# Patient Record
Sex: Female | Born: 1995 | Race: White | Hispanic: No | Marital: Single | Smoking: Never smoker
Health system: Southern US, Community
[De-identification: ages and names within clinical notes are randomized; demographics above are authoritative.]

## PROBLEM LIST (undated history)

## (undated) DIAGNOSIS — Z8619 Personal history of other infectious and parasitic diseases: Secondary | ICD-10-CM

## (undated) HISTORY — DX: Personal history of other infectious and parasitic diseases: Z86.19

---
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
## 2017-04-14 ENCOUNTER — Emergency Department (HOSPITAL_COMMUNITY)
Admission: EM | Admit: 2017-04-14 | Discharge: 2017-04-14 | Disposition: A | Discharge status: Home / Self Care | Payer: Self-pay | Attending: Emergency Medicine | Admitting: Emergency Medicine

## 2017-04-14 ENCOUNTER — Encounter (HOSPITAL_COMMUNITY): Payer: Self-pay | Admitting: Emergency Medicine

## 2017-04-14 ENCOUNTER — Other Ambulatory Visit: Payer: Self-pay

## 2017-04-14 DIAGNOSIS — R112 Nausea with vomiting, unspecified: Secondary | ICD-10-CM | POA: Insufficient documentation

## 2017-04-14 DIAGNOSIS — R197 Diarrhea, unspecified: Secondary | ICD-10-CM

## 2017-04-14 DIAGNOSIS — N390 Urinary tract infection, site not specified: Secondary | ICD-10-CM | POA: Insufficient documentation

## 2017-04-14 DIAGNOSIS — E860 Dehydration: Secondary | ICD-10-CM | POA: Insufficient documentation

## 2017-04-14 LAB — COMPREHENSIVE METABOLIC PANEL WITH GFR
ALT: 14 U/L (ref 14–54)
AST: 15 U/L (ref 15–41)
Albumin: 4.7 g/dL (ref 3.5–5.0)
Alkaline Phosphatase: 49 U/L (ref 38–126)
Anion gap: 13 (ref 5–15)
BUN: 12 mg/dL (ref 6–20)
CO2: 22 mmol/L (ref 22–32)
Calcium: 9.5 mg/dL (ref 8.9–10.3)
Chloride: 102 mmol/L (ref 101–111)
Creatinine, Ser: 0.61 mg/dL (ref 0.44–1.00)
GFR calc Af Amer: >60 mL/min
GFR calc non Af Amer: >60 mL/min
Glucose, Bld: 92 mg/dL (ref 65–99)
Potassium: 3.4 mmol/L — ABNORMAL LOW (ref 3.5–5.1)
Sodium: 137 mmol/L (ref 135–145)
Total Bilirubin: 1.1 mg/dL (ref 0.3–1.2)
Total Protein: 8 g/dL (ref 6.5–8.1)

## 2017-04-14 LAB — URINALYSIS, ROUTINE W REFLEX MICROSCOPIC
Bilirubin Urine: NEGATIVE
Glucose, UA: NEGATIVE mg/dL
Hgb urine dipstick: NEGATIVE
Ketones, ur: 80 mg/dL — AB
Nitrite: NEGATIVE
Protein, ur: 30 mg/dL — AB
Specific Gravity, Urine: 1.031 — ABNORMAL HIGH (ref 1.005–1.030)
pH: 5 (ref 5.0–8.0)

## 2017-04-14 LAB — CBC
HCT: 44.9 % (ref 36.0–46.0)
Hemoglobin: 14.5 g/dL (ref 12.0–15.0)
MCH: 28.2 pg (ref 26.0–34.0)
MCHC: 32.3 g/dL (ref 30.0–36.0)
MCV: 87.2 fL (ref 78.0–100.0)
Platelets: 295 K/uL (ref 150–400)
RBC: 5.15 MIL/uL — ABNORMAL HIGH (ref 3.87–5.11)
RDW: 12.9 % (ref 11.5–15.5)
WBC: 6.6 K/uL (ref 4.0–10.5)

## 2017-04-14 LAB — PREGNANCY, URINE: Preg Test, Ur: NEGATIVE

## 2017-04-14 LAB — LIPASE, BLOOD: Lipase: 22 U/L (ref 11–51)

## 2017-04-14 MED ORDER — CEPHALEXIN 500 MG PO CAPS: 500.0000 mg | ORAL_CAPSULE | Freq: Four times a day (QID) | ORAL | 0 refills | Status: DC

## 2017-04-14 MED ORDER — ONDANSETRON HCL 4 MG PO TABS: 4.0000 mg | ORAL_TABLET | Freq: Four times a day (QID) | ORAL | 0 refills | Status: DC

## 2017-04-14 MED ORDER — SODIUM CHLORIDE 0.9 % IV BOLUS (SEPSIS)
1000.0000 mL | Freq: Once | INTRAVENOUS | Status: AC
Start: 2017-04-14 — End: 2017-04-14
  Administered 2017-04-14: 1000 mL via INTRAVENOUS

## 2017-04-14 MED ORDER — ONDANSETRON HCL 4 MG/2ML IJ SOLN
4.0000 mg | Freq: Once | INTRAMUSCULAR | Status: AC
  Administered 2017-04-14: 4 mg via INTRAVENOUS
  Filled 2017-04-14: qty 2

## 2017-04-14 NOTE — ED Notes (Signed)
Pt c/o n/v for the past two days, EDP in prior to RN, see edp assessment for further,

## 2017-04-14 NOTE — ED Provider Notes (Signed)
[facility] EMERGENCY DEPARTMENT Provider Note   CSN: [id number] Arrival date & time: 04/14/17  1603     History   Chief Complaint Chief Complaint  Patient presents with  . Diarrhea    HPI Felicia Rodriguez is a 22 y.o. female.  She presents to the emergency department with nausea and vomiting and diarrhea for 3 days.  She continues to have multiple episodes of vomiting.  The diarrhea in the vomitus is nonbloody.  There is been no associated fever.  No sick contacts.  She is tried Pepto and applesauce without any relief.  She is no surgical history.  There are no urinary symptoms no vaginal bleeding no discharge.  Last menstrual period was the middle of February. The history is provided by the patient.  Diarrhea   This is a new problem. The current episode started more than 2 days ago. The problem occurs 2 to 4 times per day. The problem has not changed since onset.The stool consistency is described as watery. There has been no fever. Associated symptoms include abdominal pain (cramps) and vomiting. Pertinent negatives include no chills, no sweats, no headaches, no arthralgias, no myalgias, no URI and no cough. She has tried [employer] for the symptoms. The treatment provided no relief. Her past medical history does not include inflammatory bowel disease or recent abdominal surgery.    History reviewed. No pertinent past medical history.  There are no active problems to display for this patient.   History reviewed. No pertinent surgical history.  OB History    Gravida Para Term Preterm AB Living   0 0 0 0 0 0   SAB TAB Ectopic Multiple Live Births   0 0 0 0 0       Home Medications    Prior to Admission medications   Not on File    Family History History reviewed. No pertinent family history.  Social History Social History   Tobacco Use  . Smoking status: Never Smoker  . Smokeless tobacco: Never Used  Substance Use Topics  . Alcohol use: No    Frequency: Never    . Drug use: No     Allergies   Shellfish allergy   Review of Systems Review of Systems  Constitutional: Negative for chills and fever.  HENT: Negative for ear pain and sore throat.   Eyes: Negative for pain and visual disturbance.  Respiratory: Negative for cough and shortness of breath.   Cardiovascular: Negative for chest pain and palpitations.  Gastrointestinal: Positive for abdominal pain (cramps), diarrhea and vomiting.  Genitourinary: Negative for dysuria and hematuria.  Musculoskeletal: Negative for arthralgias, back pain and myalgias.  Skin: Negative for color change and rash.  Neurological: Negative for seizures, syncope and headaches.  All other systems reviewed and are negative.    Physical Exam Updated Vital Signs BP 136/79 (BP Location: Left Arm)   Pulse 88   Temp 98.3 F (36.8 C) (Oral)   Resp 18   Ht 5\' 8"  (1.727 m)   Wt 68.9 kg (152 lb)   LMP 03/21/2017   SpO2 100%   BMI 23.11 kg/m   Physical Exam  Constitutional: She appears well-developed and well-nourished. No distress.  HENT:  Head: Normocephalic and atraumatic.  Eyes: Conjunctivae are normal.  Neck: Neck supple.  Cardiovascular: Normal rate and regular rhythm.  No murmur heard. Pulmonary/Chest: Effort normal and breath sounds normal. No respiratory distress.  Abdominal: Soft. She exhibits no mass. There is no tenderness. There is no guarding.  Musculoskeletal: She exhibits no edema or tenderness.  Neurological: She is alert.  Skin: Skin is warm and dry.  Psychiatric: She has a normal mood and affect.  Nursing note and vitals reviewed.    ED Treatments / Results  Labs (all labs ordered are listed, but only abnormal results are displayed) Labs Reviewed  COMPREHENSIVE METABOLIC PANEL - Abnormal; Notable for the following components:      Result Value   Potassium 3.4 (*)    All other components within normal limits  CBC - Abnormal; Notable for the following components:   RBC 5.15 (*)     All other components within normal limits  URINALYSIS, ROUTINE W REFLEX MICROSCOPIC - Abnormal; Notable for the following components:   Color, Urine AMBER (*)    APPearance TURBID (*)    Specific Gravity, Urine 1.031 (*)    Ketones, ur 80 (*)    Protein, ur 30 (*)    Leukocytes, UA LARGE (*)    Bacteria, UA FEW (*)    Squamous Epithelial / LPF 6-30 (*)    All other components within normal limits  LIPASE, BLOOD  PREGNANCY, URINE    EKG  EKG Interpretation None       Radiology No results found.  Procedures Procedures (including critical care time)  Medications Ordered in ED Medications  sodium chloride 0.9 % bolus 1,000 mL (not administered)  ondansetron (ZOFRAN) injection 4 mg (not administered)     Initial Impression / Assessment and Plan / ED Course  I have reviewed the triage vital signs and the nursing notes.  Pertinent labs & imaging results that were available during my care of the patient were reviewed by me and considered in my medical decision making (see chart for details).     Final Clinical Impressions(s) / ED Diagnoses   Final diagnoses:  Nausea vomiting and diarrhea  Dehydration  Urinary tract infection in female    ED Discharge Orders        Ordered    cephALEXin (KEFLEX) 500 MG capsule  4 times daily,   Status:  Discontinued     04/14/17 2135    ondansetron (ZOFRAN) 4 MG tablet  Every 6 hours,   Status:  Discontinued     04/14/17 2135    cephALEXin (KEFLEX) 500 MG capsule  4 times daily     04/14/17 2138    ondansetron (ZOFRAN) 4 MG tablet  Every 6 hours     04/14/17 2138       Terrilee FilesButler, Kelsee Preslar C, MD 04/15/17 2133

## 2017-04-14 NOTE — ED Triage Notes (Signed)
PT c/o n/v/d x2 days. PT states no vomiting today but had 3 episodes of diarrhea but continues with nausea.

## 2017-04-14 NOTE — ED Notes (Signed)
PT tolerating po fluids with no problems noted,

## 2017-04-14 NOTE — Discharge Instructions (Signed)
Your evaluated in the emergency department for nausea and vomiting diarrhea.  Your blood work showed that you were dehydrated and have a possible urine infection.  You were given fluids and nausea medicine and are being sent home with nausea medicine and an antibiotic.  You should continue to try to stay well-hydrated and return if any worsening symptoms.

## 2017-04-14 NOTE — ED Notes (Signed)
Pt had one episode of vomiting while in the waiting room.

## 2020-06-09 ENCOUNTER — Encounter (HOSPITAL_COMMUNITY): Payer: Self-pay

## 2020-06-09 ENCOUNTER — Emergency Department (HOSPITAL_COMMUNITY)
Admission: EM | Admit: 2020-06-09 | Discharge: 2020-06-09 | Discharge status: Against Medical Advice | Payer: Self-pay | Attending: Emergency Medicine | Admitting: Emergency Medicine

## 2020-06-09 ENCOUNTER — Emergency Department (HOSPITAL_COMMUNITY): Payer: Self-pay

## 2020-06-09 DIAGNOSIS — S20212A Contusion of left front wall of thorax, initial encounter: Secondary | ICD-10-CM

## 2020-06-09 DIAGNOSIS — Z5321 Procedure and treatment not carried out due to patient leaving prior to being seen by health care provider: Secondary | ICD-10-CM

## 2020-06-09 DIAGNOSIS — M542 Cervicalgia: Secondary | ICD-10-CM | POA: Insufficient documentation

## 2020-06-09 DIAGNOSIS — R519 Headache, unspecified: Secondary | ICD-10-CM | POA: Insufficient documentation

## 2020-06-09 DIAGNOSIS — M25461 Effusion, right knee: Secondary | ICD-10-CM

## 2020-06-09 DIAGNOSIS — T7421XA Adult sexual abuse, confirmed, initial encounter: Secondary | ICD-10-CM

## 2020-06-09 DIAGNOSIS — S8001XA Contusion of right knee, initial encounter: Secondary | ICD-10-CM | POA: Insufficient documentation

## 2020-06-09 DIAGNOSIS — M545 Low back pain, unspecified: Secondary | ICD-10-CM | POA: Insufficient documentation

## 2020-06-09 DIAGNOSIS — M25561 Pain in right knee: Secondary | ICD-10-CM

## 2020-06-09 LAB — POC URINE PREG, ED: Preg Test, Ur: NEGATIVE

## 2020-06-09 MED ORDER — LIDOCAINE HCL (PF) 1 % IJ SOLN
1.0000 mL | Freq: Once | INTRAMUSCULAR | Status: AC
  Administered 2020-06-09: 1 mL
  Filled 2020-06-09: qty 30

## 2020-06-09 MED ORDER — AZITHROMYCIN 250 MG PO TABS
1000.0000 mg | ORAL_TABLET | Freq: Once | ORAL | Status: AC
  Administered 2020-06-09: 1000 mg via ORAL
  Filled 2020-06-09: qty 4

## 2020-06-09 MED ORDER — METRONIDAZOLE 500 MG PO TABS
2000.0000 mg | ORAL_TABLET | Freq: Once | ORAL | Status: AC
  Administered 2020-06-09: 2000 mg via ORAL
  Filled 2020-06-09: qty 4

## 2020-06-09 MED ORDER — CEFTRIAXONE SODIUM 1 G IJ SOLR
500.0000 mg | Freq: Once | INTRAMUSCULAR | Status: AC
  Administered 2020-06-09: 500 mg via INTRAMUSCULAR
  Filled 2020-06-09: qty 10
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
## 2020-06-09 NOTE — ED Notes (Addendum)
Patient not in her room. Calls to her phone have been made to locate her with no success. PA made aware.
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
## 2020-06-10 ENCOUNTER — Emergency Department (HOSPITAL_COMMUNITY): Payer: Self-pay

## 2020-06-10 ENCOUNTER — Encounter (HOSPITAL_COMMUNITY): Payer: Self-pay

## 2020-06-10 ENCOUNTER — Other Ambulatory Visit: Payer: Self-pay

## 2020-06-10 ENCOUNTER — Emergency Department (HOSPITAL_COMMUNITY)
Admission: EM | Admit: 2020-06-10 | Discharge: 2020-06-10 | Disposition: A | Discharge status: Home / Self Care | Payer: Self-pay | Attending: Emergency Medicine | Admitting: Emergency Medicine

## 2020-06-10 DIAGNOSIS — S0003XA Contusion of scalp, initial encounter: Secondary | ICD-10-CM | POA: Insufficient documentation

## 2020-06-10 DIAGNOSIS — S0990XA Unspecified injury of head, initial encounter: Secondary | ICD-10-CM

## 2020-06-10 DIAGNOSIS — R197 Diarrhea, unspecified: Secondary | ICD-10-CM | POA: Insufficient documentation

## 2020-06-10 MED ORDER — ONDANSETRON 4 MG PO TBDP: 4.0000 mg | ORAL_TABLET | Freq: Three times a day (TID) | ORAL | 0 refills | Status: DC | PRN

## 2020-06-10 MED ORDER — ONDANSETRON 4 MG PO TBDP
4.0000 mg | ORAL_TABLET | Freq: Once | ORAL | Status: AC
  Administered 2020-06-10: 4 mg via ORAL
  Filled 2020-06-10: qty 1

## 2020-06-10 MED ORDER — ACETAMINOPHEN 325 MG PO TABS
650.0000 mg | ORAL_TABLET | Freq: Once | ORAL | Status: AC
  Administered 2020-06-10: 650 mg via ORAL
  Filled 2020-06-10: qty 2

## 2020-06-10 MED ORDER — DOXYCYCLINE HYCLATE 100 MG PO CAPS: 100.0000 mg | ORAL_CAPSULE | Freq: Two times a day (BID) | ORAL | 0 refills | Status: DC
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
## 2020-06-13 ENCOUNTER — Telehealth: Payer: Self-pay | Admitting: Family Medicine
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
## 2020-06-13 NOTE — Telephone Encounter (Signed)
Called pt and scheduled her for Thursday, May 12th at 11:15 am.
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
## 2020-06-15 ENCOUNTER — Ambulatory Visit (INDEPENDENT_AMBULATORY_CARE_PROVIDER_SITE_OTHER): Payer: Self-pay | Admitting: Family Medicine

## 2020-06-15 ENCOUNTER — Other Ambulatory Visit: Payer: Self-pay

## 2020-06-15 VITALS — Ht 68.0 in | Wt 175.4 lb

## 2020-06-15 DIAGNOSIS — R21 Rash and other nonspecific skin eruption: Secondary | ICD-10-CM

## 2020-06-15 DIAGNOSIS — T7491XA Unspecified adult maltreatment, confirmed, initial encounter: Secondary | ICD-10-CM

## 2020-06-15 DIAGNOSIS — W57XXXA Bitten or stung by nonvenomous insect and other nonvenomous arthropods, initial encounter: Secondary | ICD-10-CM

## 2020-06-15 DIAGNOSIS — S060X0A Concussion without loss of consciousness, initial encounter: Secondary | ICD-10-CM

## 2020-06-15 DIAGNOSIS — S40862A Insect bite (nonvenomous) of left upper arm, initial encounter: Secondary | ICD-10-CM

## 2020-06-15 MED ORDER — TRIAMCINOLONE ACETONIDE 0.1 % EX CREA: 1.0000 "application " | TOPICAL_CREAM | Freq: Two times a day (BID) | CUTANEOUS | 1 refills | Status: DC

## 2020-06-15 MED ORDER — NORTRIPTYLINE HCL 25 MG PO CAPS: 25.0000 mg | ORAL_CAPSULE | Freq: Every day | ORAL | 2 refills | Status: DC

## 2020-06-15 MED ORDER — DOXYCYCLINE HYCLATE 100 MG PO CAPS: 100.0000 mg | ORAL_CAPSULE | Freq: Two times a day (BID) | ORAL | 0 refills | Status: AC

## 2020-06-15 NOTE — Patient Instructions (Addendum)
Thank you for coming in today.  Try the nortyptline at bedtime.  It should help with sleep and headache.   I would like to see you in 2 weeks.    [facility] [address] [place], [place] [address] [phone] Main [phone] Direct  [facility] [address] [place], [place] [address] [phone]) [phone] Direct   If you are in immediate danger, please call 911, or [facility] 24 hour Crisis Hotline at [phone].

## 2020-06-16 ENCOUNTER — Other Ambulatory Visit: Payer: Self-pay

## 2020-06-16 ENCOUNTER — Telehealth: Payer: Self-pay | Admitting: Family Medicine

## 2020-06-16 ENCOUNTER — Emergency Department (HOSPITAL_COMMUNITY)
Admission: EM | Admit: 2020-06-16 | Discharge: 2020-06-16 | Discharge status: Against Medical Advice | Payer: Self-pay | Attending: Emergency Medicine | Admitting: Emergency Medicine

## 2020-06-16 ENCOUNTER — Encounter (HOSPITAL_COMMUNITY): Payer: Self-pay

## 2020-06-16 DIAGNOSIS — W57XXXA Bitten or stung by nonvenomous insect and other nonvenomous arthropods, initial encounter: Secondary | ICD-10-CM | POA: Insufficient documentation

## 2020-06-16 DIAGNOSIS — S40862A Insect bite (nonvenomous) of left upper arm, initial encounter: Secondary | ICD-10-CM | POA: Insufficient documentation

## 2020-06-16 DIAGNOSIS — R21 Rash and other nonspecific skin eruption: Secondary | ICD-10-CM

## 2020-06-16 MED ORDER — PREDNISONE 50 MG PO TABS: 50.0000 mg | ORAL_TABLET | Freq: Every day | ORAL | 0 refills | Status: DC

## 2020-06-16 NOTE — Telephone Encounter (Signed)
STOP doxycycline. It is possible that you are allergic to it.   START prednisone now.   I tried to call and cannot leave a message. I sent her a text asking her to call back.

## 2020-06-16 NOTE — ED Triage Notes (Signed)
Pt presents to ED with complaints of itchy rash all over body and swelling around eyes started yesterday. Pt was given Triamcinolone Cream 0.1% yesterday and used it but states the rash is worse this am.

## 2020-06-16 NOTE — Discharge Instructions (Addendum)
Make sure to take the doxycycline as directed until finished.  This is very important in preventing tickborne illness.  Please also pick up the prednisone prescribed by Dr. Denyse Amass. Return to the ER for any new or

## 2020-06-16 NOTE — Telephone Encounter (Signed)
Pt awoke this morning and her rash is much worse. She used the cream twice, her arms seem better but she now how the rash on her face and her eyes are swollen.  She is considering going to the ED but wanted to see what Dr. Denyse Amass thought she should do.
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
## 2020-06-16 NOTE — Telephone Encounter (Signed)
Haeli texted me back and I called her and we had a phone call.  The rash has escalated but she does not have any other systemic signs or symptoms such as fever or chills or significant body aches.  I suspect the rash is probably due to the doxycycline that was prescribed starting on May 7.  Advised her to immediately discontinue doxycycline.  Prescribed prednisone.  Recommended Benadryl 50 mg every 6 hours and famotidine twice daily.  Also recommend Goldbond itch.  Advised her that if her symptoms worsen she should go to the emergency room over the weekend.  Recommended she communicate with me future updates via MyChart messages if possible.
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
## 2020-06-29 ENCOUNTER — Ambulatory Visit: Payer: Self-pay | Admitting: Family Medicine

## 2020-09-03 ENCOUNTER — Emergency Department (HOSPITAL_COMMUNITY)
Admission: EM | Admit: 2020-09-03 | Discharge: 2020-09-03 | Disposition: A | Discharge status: Home / Self Care | Payer: Self-pay | Attending: Emergency Medicine | Admitting: Emergency Medicine

## 2020-09-03 ENCOUNTER — Encounter (HOSPITAL_COMMUNITY): Payer: Self-pay | Admitting: Emergency Medicine

## 2020-09-03 ENCOUNTER — Other Ambulatory Visit: Payer: Self-pay

## 2020-09-03 DIAGNOSIS — B9689 Other specified bacterial agents as the cause of diseases classified elsewhere: Secondary | ICD-10-CM | POA: Insufficient documentation

## 2020-09-03 DIAGNOSIS — N760 Acute vaginitis: Secondary | ICD-10-CM | POA: Insufficient documentation

## 2020-09-03 LAB — URINALYSIS, ROUTINE W REFLEX MICROSCOPIC
Bilirubin Urine: NEGATIVE
Glucose, UA: NEGATIVE mg/dL
Hgb urine dipstick: NEGATIVE
Ketones, ur: NEGATIVE mg/dL
Nitrite: NEGATIVE
Protein, ur: NEGATIVE mg/dL
Specific Gravity, Urine: 1.018 (ref 1.005–1.030)
pH: 6 (ref 5.0–8.0)

## 2020-09-03 LAB — HIV ANTIBODY (ROUTINE TESTING W REFLEX): HIV Screen 4th Generation wRfx: NONREACTIVE

## 2020-09-03 LAB — WET PREP, GENITAL
Sperm: NONE SEEN
Trich, Wet Prep: NONE SEEN
Yeast Wet Prep HPF POC: NONE SEEN

## 2020-09-03 LAB — PREGNANCY, URINE: Preg Test, Ur: NEGATIVE

## 2020-09-03 MED ORDER — METRONIDAZOLE 500 MG PO TABS: 500.0000 mg | ORAL_TABLET | Freq: Two times a day (BID) | ORAL | 0 refills | Status: DC

## 2020-09-03 NOTE — ED Triage Notes (Signed)
Patient requesting STD testing. Denies STD symptoms. Reports having unprotected sex.

## 2020-09-03 NOTE — ED Provider Notes (Signed)
[facility]-EMERGENCY DEPT Provider Note   CSN: [id number] Arrival date & time: 09/03/20  1322     History No chief complaint on file.   Felicia Rodriguez is a 25 y.o. female.  Patient is a 25 year old female who presents with vaginal itching and discharge.  She states is been going on for the last couple days.  She also has some lower abdominal cramping.  No nausea or vomiting.  No fevers.  She does have a new sexual partner and she wants to be tested for STDs.  She does have a prior history of gonorrhea and chlamydia.  She is having unprotected sex.      History reviewed. No pertinent past medical history.  There are no problems to display for this patient.   History reviewed. No pertinent surgical history.   OB History     Gravida  0   Para  0   Term  0   Preterm  0   AB  0   Living  0      SAB  0   IAB  0   Ectopic  0   Multiple  0   Live Births  0           No family history on file.  Social History   Tobacco Use   Smoking status: Never   Smokeless tobacco: Never  Vaping Use   Vaping Use: Never used  Substance Use Topics   Alcohol use: No   Drug use: No    Home Medications Prior to Admission medications   Medication Sig Start Date End Date Taking? Authorizing Provider  metroNIDAZOLE (FLAGYL) 500 MG tablet Take 1 tablet (500 mg total) by mouth 2 (two) times daily. One po bid x 7 days 09/03/20  Yes Rolan Bucco, MD  nortriptyline (PAMELOR) 25 MG capsule Take 1 capsule (25 mg total) by mouth at bedtime. 06/15/20   Rodolph Bong, MD  ondansetron (ZOFRAN ODT) 4 MG disintegrating tablet Take 1 tablet (4 mg total) by mouth every 8 (eight) hours as needed for nausea or vomiting. 06/10/20   Cristina Gong, PA-C  ondansetron (ZOFRAN) 4 MG tablet Take 1 tablet (4 mg total) by mouth every 6 (six) hours. Patient not taking: No sig reported 04/14/17   Terrilee Files, MD  predniSONE (DELTASONE) 50 MG tablet Take 1 tablet (50  mg total) by mouth daily. 06/16/20   Rodolph Bong, MD  triamcinolone cream (KENALOG) 0.1 % Apply 1 application topically 2 (two) times daily. 06/15/20   Rodolph Bong, MD    Allergies    Shellfish allergy  Review of Systems   Review of Systems  Constitutional:  Negative for chills, diaphoresis, fatigue and fever.  HENT:  Negative for congestion, rhinorrhea and sneezing.   Eyes: Negative.   Respiratory:  Negative for cough, chest tightness and shortness of breath.   Cardiovascular:  Negative for chest pain and leg swelling.  Gastrointestinal:  Negative for abdominal pain, blood in stool, diarrhea, nausea and vomiting.  Genitourinary:  Positive for pelvic pain and vaginal discharge. Negative for difficulty urinating, flank pain, frequency, hematuria and vaginal bleeding.  Musculoskeletal:  Negative for arthralgias and back pain.  Skin:  Negative for rash.  Neurological:  Negative for dizziness, speech difficulty, weakness, numbness and headaches.   Physical Exam Updated Vital Signs BP 115/69   Pulse 66   Temp 97.8 F (36.6 C) (Oral)   Resp 17   LMP 08/22/2020  SpO2 99%   Physical Exam Constitutional:      Appearance: She is well-developed.  HENT:     Head: Normocephalic and atraumatic.  Eyes:     Pupils: Pupils are equal, round, and reactive to light.  Cardiovascular:     Rate and Rhythm: Normal rate and regular rhythm.     Heart sounds: Normal heart sounds.  Pulmonary:     Effort: Pulmonary effort is normal. No respiratory distress.     Breath sounds: Normal breath sounds. No wheezing or rales.  Chest:     Chest wall: No tenderness.  Abdominal:     General: Bowel sounds are normal.     Palpations: Abdomen is soft.     Tenderness: There is no abdominal tenderness. There is no guarding or rebound.  Genitourinary:    Comments: Positive moderate amount of thick white discharge, no cervical motion tenderness, no adnexal tenderness, no rashes or other  lesions Musculoskeletal:        General: Normal range of motion.     Cervical back: Normal range of motion and neck supple.  Lymphadenopathy:     Cervical: No cervical adenopathy.  Skin:    General: Skin is warm and dry.     Findings: No rash.  Neurological:     Mental Status: She is alert and oriented to person, place, and time.    ED Results / Procedures / Treatments   Labs (all labs ordered are listed, but only abnormal results are displayed) Labs Reviewed  WET PREP, GENITAL - Abnormal; Notable for the following components:      Result Value   Clue Cells Wet Prep HPF POC PRESENT (*)    WBC, Wet Prep HPF POC FEW (*)    All other components within normal limits  URINALYSIS, ROUTINE W REFLEX MICROSCOPIC - Abnormal; Notable for the following components:   APPearance HAZY (*)    Leukocytes,Ua TRACE (*)    Bacteria, UA RARE (*)    All other components within normal limits  PREGNANCY, URINE  RPR  HIV ANTIBODY (ROUTINE TESTING W REFLEX)  GC/CHLAMYDIA PROBE AMP ([place]) NOT AT [facility]    EKG None  Radiology No results found.  Procedures Procedures   Medications Ordered in ED Medications - No data to display  ED Course  I have reviewed the triage vital signs and the nursing notes.  Pertinent labs & imaging results that were available during my care of the patient were reviewed by me and considered in my medical decision making (see chart for details).    MDM Rules/Calculators/A&P                           Patient is a 25 year old female who presents with vaginal discharge.  She does have evidence of BV.  STD testing is pending.  She was started on Flagyl.  She was counseled on safe sex practices.  Return precautions were given. Final Clinical Impression(s) / ED Diagnoses Final diagnoses:  BV (bacterial vaginosis)    Rx / DC Orders ED Discharge Orders          Ordered    metroNIDAZOLE (FLAGYL) 500 MG tablet  2 times daily        09/03/20 1604              Rolan Bucco, MD 09/03/20 1606

## 2020-09-04 LAB — GC/CHLAMYDIA PROBE AMP (~~LOC~~) NOT AT ARMC
Chlamydia: NEGATIVE
Comment: NEGATIVE
Comment: NORMAL
Neisseria Gonorrhea: NEGATIVE

## 2020-09-04 LAB — RPR: RPR Ser Ql: NONREACTIVE

## 2021-04-03 ENCOUNTER — Encounter (HOSPITAL_BASED_OUTPATIENT_CLINIC_OR_DEPARTMENT_OTHER): Payer: Self-pay | Admitting: Urology

## 2021-04-03 ENCOUNTER — Other Ambulatory Visit: Payer: Self-pay

## 2021-04-03 ENCOUNTER — Emergency Department (HOSPITAL_BASED_OUTPATIENT_CLINIC_OR_DEPARTMENT_OTHER)
Admission: EM | Admit: 2021-04-03 | Discharge: 2021-04-03 | Disposition: A | Discharge status: Home / Self Care | Payer: BLUE CROSS/BLUE SHIELD | Attending: Emergency Medicine | Admitting: Emergency Medicine

## 2021-04-03 ENCOUNTER — Emergency Department (HOSPITAL_BASED_OUTPATIENT_CLINIC_OR_DEPARTMENT_OTHER): Payer: BLUE CROSS/BLUE SHIELD

## 2021-04-03 DIAGNOSIS — O26851 Spotting complicating pregnancy, first trimester: Secondary | ICD-10-CM | POA: Diagnosis not present

## 2021-04-03 DIAGNOSIS — O26899 Other specified pregnancy related conditions, unspecified trimester: Secondary | ICD-10-CM

## 2021-04-03 DIAGNOSIS — Z3A01 Less than 8 weeks gestation of pregnancy: Secondary | ICD-10-CM | POA: Insufficient documentation

## 2021-04-03 DIAGNOSIS — R102 Pelvic and perineal pain: Secondary | ICD-10-CM

## 2021-04-03 DIAGNOSIS — O4690 Antepartum hemorrhage, unspecified, unspecified trimester: Secondary | ICD-10-CM

## 2021-04-03 LAB — URINALYSIS, ROUTINE W REFLEX MICROSCOPIC
Bilirubin Urine: NEGATIVE
Glucose, UA: NEGATIVE mg/dL
Hgb urine dipstick: NEGATIVE
Ketones, ur: NEGATIVE mg/dL
Nitrite: NEGATIVE
Specific Gravity, Urine: 1.019 (ref 1.005–1.030)
pH: 7 (ref 5.0–8.0)

## 2021-04-03 LAB — WET PREP, GENITAL
Clue Cells Wet Prep HPF POC: NONE SEEN
Sperm: NONE SEEN
Trich, Wet Prep: NONE SEEN
WBC, Wet Prep HPF POC: <10 (ref <10)
Yeast Wet Prep HPF POC: NONE SEEN

## 2021-04-03 LAB — PREGNANCY, URINE: Preg Test, Ur: POSITIVE — AB

## 2021-04-03 LAB — HIV ANTIBODY (ROUTINE TESTING W REFLEX): HIV Screen 4th Generation wRfx: NONREACTIVE

## 2021-04-03 LAB — HCG, QUANTITATIVE, PREGNANCY: hCG, Beta Chain, Quant, S: 1147 m[IU]/mL — ABNORMAL HIGH (ref <5)

## 2021-04-03 LAB — ABO/RH: ABO/RH(D): O POS

## 2021-04-03 NOTE — Discharge Instructions (Signed)
Please start taking prenatal vitamins to ensure healthy pregnancy.  You will need to have your beta hCG levels checked in 48 hours and you will need to be reassessed at that time as we have not fully ruled out an ectopic pregnancy.  You will also need to repeat ultrasound.  If you have any new or worsening symptoms in the meantime please report to the emergency department immediately.

## 2021-04-03 NOTE — ED Provider Notes (Signed)
[facility] EMERGENCY DEPT Provider Note   CSN: [id number] Arrival date & time: 04/03/21  1017     History  Chief Complaint  Patient presents with   Abdominal Pain    Felicia Rodriguez is a 26 y.o. female.  HPI   Pt is a 26 y/o female who no known PMHx who presents to the ED today for eval of abd cramping and spotting that started 6 days ago. She reports increased urinary frequency, but denies dysuria or hematuria. She reports some mildly increased vaginal discharge. She had a positive pregnancy test at home.  LMP was 1/23.  Home Medications Prior to Admission medications   Medication Sig Start Date End Date Taking? Authorizing Provider  metroNIDAZOLE (FLAGYL) 500 MG tablet Take 1 tablet (500 mg total) by mouth 2 (two) times daily. One po bid x 7 days 09/03/20   Malvin Johns, MD  nortriptyline (PAMELOR) 25 MG capsule Take 1 capsule (25 mg total) by mouth at bedtime. 06/15/20   Gregor Hams, MD  ondansetron (ZOFRAN ODT) 4 MG disintegrating tablet Take 1 tablet (4 mg total) by mouth every 8 (eight) hours as needed for nausea or vomiting. 06/10/20   Lorin Glass, PA-C  ondansetron (ZOFRAN) 4 MG tablet Take 1 tablet (4 mg total) by mouth every 6 (six) hours. Patient not taking: No sig reported 04/14/17   Hayden Rasmussen, MD  predniSONE (DELTASONE) 50 MG tablet Take 1 tablet (50 mg total) by mouth daily. 06/16/20   Gregor Hams, MD  triamcinolone cream (KENALOG) 0.1 % Apply 1 application topically 2 (two) times daily. 06/15/20   Gregor Hams, MD      Allergies    Shellfish allergy    Review of Systems   Review of Systems See HPI for pertinent positives or negatives.   Physical Exam Updated Vital Signs BP 106/60 (BP Location: Right Arm)    Pulse 61    Temp 98.7 F (37.1 C) (Oral)    Resp 18    Ht 5\' 8"  (1.727 m)    Wt 78.5 kg    LMP 02/26/2021 (Exact Date) Comment: positive home preg test   SpO2 100%    BMI 26.30 kg/m  Physical Exam Vitals and nursing note  reviewed.  Constitutional:      General: She is not in acute distress.    Appearance: She is well-developed.  HENT:     Head: Normocephalic and atraumatic.  Eyes:     Conjunctiva/sclera: Conjunctivae normal.  Cardiovascular:     Rate and Rhythm: Normal rate and regular rhythm.     Heart sounds: No murmur heard. Pulmonary:     Effort: Pulmonary effort is normal. No respiratory distress.     Breath sounds: Normal breath sounds.  Abdominal:     Palpations: Abdomen is soft.     Tenderness: There is abdominal tenderness in the right lower quadrant, suprapubic area and left lower quadrant.  Musculoskeletal:        General: No swelling.     Cervical back: Neck supple.  Skin:    General: Skin is warm and dry.     Capillary Refill: Capillary refill takes less than 2 seconds.  Neurological:     Mental Status: She is alert.  Psychiatric:        Mood and Affect: Mood normal.    ED Results / Procedures / Treatments   Labs (all labs ordered are listed, but only abnormal results are displayed) Labs Reviewed  URINALYSIS, ROUTINE  W REFLEX MICROSCOPIC - Abnormal; Notable for the following components:      Result Value   Protein, ur TRACE (*)    Leukocytes,Ua LARGE (*)    All other components within normal limits  PREGNANCY, URINE - Abnormal; Notable for the following components:   Preg Test, Ur POSITIVE (*)    All other components within normal limits  HCG, QUANTITATIVE, PREGNANCY - Abnormal; Notable for the following components:   hCG, Beta Chain, Quant, S 1,147 (*)    All other components within normal limits  WET PREP, GENITAL  URINE CULTURE  HIV ANTIBODY (ROUTINE TESTING W REFLEX)  RPR  ABO/RH  GC/CHLAMYDIA PROBE AMP ([place]) NOT AT [facility]    EKG None  Radiology No results found.  Procedures Procedures    Medications Ordered in ED Medications - No data to display  ED Course/ Medical Decision Making/ A&P                           Medical Decision Making Amount  and/or Complexity of Data Reviewed Labs: ordered. Radiology: ordered.   This patient presents to the ED for concern of abd pain this involves an extensive number of treatment options, and is a complaint that carries with it a high risk of complications and morbidity.  The differential diagnosis includes but is not limited to gastritis/PUD, enteritis/duodenitis, appendicitis, cholelithiasis/cholecystitis, cholangitis, pancreatitis, ruptured viscus, colitis, diverticulitis, proctitis, cystitis, pyelonephritis, ureteral colic, aortic dissection, aortic aneurysm. In women, ectopic pregnancy, pelvic inflammatory disease, ovarian cysts, and tubo-ovarian abscess were also considered. Atypical chest etiologies were also considered including ACS, PE, and pneumonia.   Additional history obtained: Records reviewed Care Everywhere/External Records  Lab Tests: I Ordered, and personally interpreted labs.  The pertinent results include:   Urine preg positive UA with large leukocytes, 0-5 rbc, 21-50 wbcs  Beta quant 1100 Hiv/rpr/gc/chlam pending at discharge Wet prep negative Abo/rh pos  Imaging Studies ordered: I ordered imaging which showed  Pelvic US - no IUP noted   I agree with the radiologist interpretation  Complexity of problems addressed: Patients presentation is most consistent with  acute complicated illness/injury requiring diagnostic workup  Disposition: After consideration of the diagnostic results and the patients response to treatment,  I feel that the patent would benefit from discharge with close follow-up.  No IUP was confirmed on pelvic ultrasound.  Patient will need to have repeat hCG levels drawn in 48 hours and will ultimately need to have a repeat ultrasound.  Ectopic is not definitively ruled out at this time.  I have low suspicion for ruptured ectopic as patient is in minimal discomfort, vital signs are normal and she is remained stable throughout her 6-hour ED stay.  Have  given her follow-up with OB/GYN.  Advised on return precautions. .  All questions answered. Pt stable for discharge.    Final Clinical Impression(s) / ED Diagnoses Final diagnoses:  Vaginal bleeding in pregnancy    Rx / DC Orders ED Discharge Orders     None         Rodney Booze, PA-C 04/03/21 1615    Wyvonnia Dusky, MD 04/03/21 [phone]

## 2021-04-03 NOTE — ED Triage Notes (Signed)
Lower abdominal cramping that started Thursday along with spotting but has stopped now  Possible pregnancy  02/26/2021 Took home preg test and was positive  G-0 , P-O  Wants confirmation of pregnancy

## 2021-04-04 LAB — GC/CHLAMYDIA PROBE AMP (~~LOC~~) NOT AT ARMC
Chlamydia: NEGATIVE
Comment: NEGATIVE
Comment: NORMAL
Neisseria Gonorrhea: NEGATIVE

## 2021-04-04 LAB — URINE CULTURE: Culture: NO GROWTH

## 2021-04-04 LAB — RPR: RPR Ser Ql: NONREACTIVE

## 2021-04-05 ENCOUNTER — Encounter (HOSPITAL_BASED_OUTPATIENT_CLINIC_OR_DEPARTMENT_OTHER): Payer: Self-pay | Admitting: Obstetrics & Gynecology

## 2021-04-05 ENCOUNTER — Ambulatory Visit (HOSPITAL_BASED_OUTPATIENT_CLINIC_OR_DEPARTMENT_OTHER): Payer: BLUE CROSS/BLUE SHIELD

## 2021-04-05 ENCOUNTER — Other Ambulatory Visit: Payer: Self-pay

## 2021-04-05 ENCOUNTER — Encounter (HOSPITAL_BASED_OUTPATIENT_CLINIC_OR_DEPARTMENT_OTHER): Payer: Self-pay

## 2021-04-05 ENCOUNTER — Ambulatory Visit (INDEPENDENT_AMBULATORY_CARE_PROVIDER_SITE_OTHER): Payer: BLUE CROSS/BLUE SHIELD | Admitting: Obstetrics & Gynecology

## 2021-04-05 ENCOUNTER — Other Ambulatory Visit (HOSPITAL_BASED_OUTPATIENT_CLINIC_OR_DEPARTMENT_OTHER): Payer: BLUE CROSS/BLUE SHIELD

## 2021-04-05 VITALS — BP 118/63 | HR 71 | Ht 68.0 in | Wt 166.4 lb

## 2021-04-05 DIAGNOSIS — O200 Threatened abortion: Secondary | ICD-10-CM

## 2021-04-05 DIAGNOSIS — O4690 Antepartum hemorrhage, unspecified, unspecified trimester: Secondary | ICD-10-CM

## 2021-04-06 ENCOUNTER — Encounter (HOSPITAL_BASED_OUTPATIENT_CLINIC_OR_DEPARTMENT_OTHER): Payer: Self-pay | Admitting: Obstetrics & Gynecology

## 2021-04-06 ENCOUNTER — Ambulatory Visit (HOSPITAL_BASED_OUTPATIENT_CLINIC_OR_DEPARTMENT_OTHER): Payer: Self-pay | Admitting: Medical

## 2021-04-06 ENCOUNTER — Telehealth (HOSPITAL_BASED_OUTPATIENT_CLINIC_OR_DEPARTMENT_OTHER): Payer: Self-pay | Admitting: Obstetrics & Gynecology

## 2021-04-06 DIAGNOSIS — O200 Threatened abortion: Secondary | ICD-10-CM | POA: Insufficient documentation

## 2021-04-06 LAB — BETA HCG QUANT (REF LAB): hCG Quant: 1951 m[IU]/mL

## 2021-04-06 NOTE — Telephone Encounter (Signed)
Pt states that her bleeding has increased from spotting to more like period bleeding now. Pt denies cramping. Advised that a repeat hcg level is recommended since her levels are rising. Pt provided with appt for bloodwork on Monday. Advised pt that if she developed severe pain/cramping or increased bleeding, feeling like she would pass out, to seek care. Pt verbalized understanding.  ?

## 2021-04-06 NOTE — Telephone Encounter (Signed)
Patient called and stated that she is bleeding more and that its brighter than yesterday would like for the nurse to call her. ?

## 2021-04-06 NOTE — Addendum Note (Signed)
Addended by: Jerene Bears on: 04/06/2021 03:58 AM ? ? Modules accepted: Orders ? ?

## 2021-04-06 NOTE — Progress Notes (Signed)
GYNECOLOGY  VISIT ? ?CC:   ER followed, threatened abortion ? ?HPI: ?26 y.o. G53P0000 Single White or Caucasian female here for complaint of vaginal spotting and positive pregnancy testing.  Seen 2/28 in emergency room.  Blood type is O+.  HCG was 1147.  Spotting is light.  Wearing mini pad.  Denies pain.  Ultrasound showed thickened endometrium at 1.0cm but no other significant findings. ? ?There are no problems to display for this patient. ? ? ?Past Medical History:  ?Diagnosis Date  ? History of chlamydia   ? ? ?No past surgical history on file. ? ?MEDS:   ?No current outpatient medications on file prior to visit.  ? ?No current facility-administered medications on file prior to visit.  ? ? ?ALLERGIES: Shellfish allergy ? ?No family history on file. ? ?SH:  single, non smoker ? ?Review of Systems  ?Genitourinary:   ?     Spotting  ? ?PHYSICAL EXAMINATION:   ? ?BP 118/63 (BP Location: Right Arm, Patient Position: Sitting, Cuff Size: Normal)   Pulse 71   Ht 5\' 8"  (1.727 m) Comment: reported  Wt 166 lb 6.4 oz (75.5 kg)   BMI 25.30 kg/m?     ?Physical Exam ?Constitutional:   ?   Appearance: Normal appearance.  ?Neurological:  ?   General: No focal deficit present.  ?   Mental Status: She is alert.  ?Psychiatric:     ?   Mood and Affect: Mood normal.  ? ? ?Assessment/Plan: ?1. Threatened abortion ?- repeat HCG obtained today.  If increasing, will repeat on Monday.  If appropriate rise, will plan follow up ultrasound. ?- recommended starting PNV ? ? ? ?

## 2021-04-09 ENCOUNTER — Other Ambulatory Visit: Payer: Self-pay

## 2021-04-09 ENCOUNTER — Other Ambulatory Visit (HOSPITAL_BASED_OUTPATIENT_CLINIC_OR_DEPARTMENT_OTHER): Payer: BLUE CROSS/BLUE SHIELD

## 2021-04-09 DIAGNOSIS — O200 Threatened abortion: Secondary | ICD-10-CM

## 2021-04-10 LAB — BETA HCG QUANT (REF LAB): hCG Quant: 6644 m[IU]/mL

## 2021-04-12 ENCOUNTER — Encounter (HOSPITAL_BASED_OUTPATIENT_CLINIC_OR_DEPARTMENT_OTHER): Payer: Self-pay | Admitting: *Deleted

## 2021-04-12 ENCOUNTER — Ambulatory Visit (HOSPITAL_BASED_OUTPATIENT_CLINIC_OR_DEPARTMENT_OTHER): Payer: BLUE CROSS/BLUE SHIELD | Admitting: Obstetrics & Gynecology
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
## 2022-02-18 ENCOUNTER — Encounter (HOSPITAL_BASED_OUTPATIENT_CLINIC_OR_DEPARTMENT_OTHER): Payer: Self-pay | Admitting: Emergency Medicine

## 2022-02-18 ENCOUNTER — Emergency Department (HOSPITAL_BASED_OUTPATIENT_CLINIC_OR_DEPARTMENT_OTHER): Payer: BLUE CROSS/BLUE SHIELD

## 2022-02-18 ENCOUNTER — Other Ambulatory Visit: Payer: Self-pay

## 2022-02-18 ENCOUNTER — Emergency Department (HOSPITAL_BASED_OUTPATIENT_CLINIC_OR_DEPARTMENT_OTHER)
Admission: EM | Admit: 2022-02-18 | Discharge: 2022-02-18 | Disposition: A | Discharge status: Home / Self Care | Payer: BLUE CROSS/BLUE SHIELD | Attending: Emergency Medicine | Admitting: Emergency Medicine

## 2022-02-18 DIAGNOSIS — O209 Hemorrhage in early pregnancy, unspecified: Secondary | ICD-10-CM | POA: Diagnosis present

## 2022-02-18 DIAGNOSIS — N939 Abnormal uterine and vaginal bleeding, unspecified: Secondary | ICD-10-CM

## 2022-02-18 LAB — WET PREP, GENITAL
Clue Cells Wet Prep HPF POC: NONE SEEN
Sperm: NONE SEEN
Trich, Wet Prep: NONE SEEN
WBC, Wet Prep HPF POC: >=10 — AB (ref <10)
Yeast Wet Prep HPF POC: NONE SEEN

## 2022-02-18 LAB — PREGNANCY, URINE: Preg Test, Ur: POSITIVE — AB

## 2022-02-18 LAB — URINALYSIS, ROUTINE W REFLEX MICROSCOPIC
Bilirubin Urine: NEGATIVE
Glucose, UA: NEGATIVE mg/dL
Ketones, ur: NEGATIVE mg/dL
Nitrite: NEGATIVE
Protein, ur: 30 mg/dL — AB
RBC / HPF: >50 RBC/hpf — ABNORMAL HIGH (ref 0–5)
Specific Gravity, Urine: 1.029 (ref 1.005–1.030)
WBC, UA: >50 WBC/hpf — ABNORMAL HIGH (ref 0–5)
pH: 5.5 (ref 5.0–8.0)

## 2022-02-18 LAB — HCG, QUANTITATIVE, PREGNANCY: hCG, Beta Chain, Quant, S: 447 m[IU]/mL — ABNORMAL HIGH (ref <5)

## 2022-02-18 MED ORDER — KETOROLAC TROMETHAMINE 15 MG/ML IJ SOLN
15.0000 mg | Freq: Once | INTRAMUSCULAR | Status: AC
  Administered 2022-02-18: 15 mg via INTRAMUSCULAR
  Filled 2022-02-18: qty 1

## 2022-02-18 MED ORDER — MISOPROSTOL 200 MCG PO TABS
800.0000 ug | ORAL_TABLET | Freq: Once | ORAL | Status: AC
  Administered 2022-02-18: 800 ug via VAGINAL
  Filled 2022-02-18: qty 4

## 2022-02-18 NOTE — ED Provider Notes (Signed)
[facility] EMERGENCY DEPT Provider Note   CSN: [id number] Arrival date & time: 02/18/22  1106     History  Chief Complaint  Patient presents with   Vaginal Bleeding   SEXUALLY TRANSMITTED DISEASE    Felicia Rodriguez is a 27 y.o. female presenting today with concern for vaginal bleeding and potential STD.  She reports that on 1/5 she took to an office abortion pill.  Then reports taking the misoprostol pill on 1/6 at home.  For the next 2 days she had heavy bleeding with passage of clots.  She continues to bleeding yesterday was bleeding heavily.  Additionally, she reports a foul odor.  Some urinary hesitancy as well.   Vaginal Bleeding Associated symptoms: vaginal discharge        Home Medications Prior to Admission medications   Not on File      Allergies    Shellfish allergy    Review of Systems   Review of Systems  Genitourinary:  Positive for menstrual problem, vaginal bleeding and vaginal discharge.    Physical Exam Updated Vital Signs BP 131/82 (BP Location: Right Arm)   Pulse 97   Temp 98.4 F (36.9 C) (Oral)   Resp 18   Ht 5\' 7"  (1.702 m)   Wt 79.4 kg   SpO2 99%   BMI 27.41 kg/m  Physical Exam Vitals and nursing note reviewed.  Constitutional:      General: She is not in acute distress.    Appearance: Normal appearance. She is not ill-appearing.  HENT:     Head: Normocephalic and atraumatic.  Eyes:     General: No scleral icterus.    Conjunctiva/sclera: Conjunctivae normal.  Pulmonary:     Effort: Pulmonary effort is normal. No respiratory distress.  Abdominal:     General: Abdomen is flat.     Palpations: Abdomen is soft.  Genitourinary:    Comments: GU exam performed in presence of female RN.  Patient in no external abnormalities.  Vaginal vault with mild amount of dark red blood.  No foreign bodies or noted lesions.  No tenderness on bimanual, no CMT Skin:    Findings: No rash.  Neurological:     Mental Status: She is  alert.  Psychiatric:        Mood and Affect: Mood normal.     ED Results / Procedures / Treatments   Labs (all labs ordered are listed, but only abnormal results are displayed) Labs Reviewed  WET PREP, GENITAL - Abnormal; Notable for the following components:      Result Value   WBC, Wet Prep HPF POC >=10 (*)    All other components within normal limits  URINALYSIS, ROUTINE W REFLEX MICROSCOPIC - Abnormal; Notable for the following components:   Color, Urine ORANGE (*)    APPearance CLOUDY (*)    Hgb urine dipstick LARGE (*)    Protein, ur 30 (*)    Leukocytes,Ua LARGE (*)    RBC / HPF >50 (*)    WBC, UA >50 (*)    Bacteria, UA RARE (*)    All other components within normal limits  PREGNANCY, URINE - Abnormal; Notable for the following components:   Preg Test, Ur POSITIVE (*)    All other components within normal limits  HCG, QUANTITATIVE, PREGNANCY  GC/CHLAMYDIA PROBE AMP ([place]) NOT AT [facility]    EKG None  Radiology US OB LESS THAN 14 WEEKS WITH OB TRANSVAGINAL  Result Date: 02/18/2022 CLINICAL DATA:  Heavy vaginal bleeding  since threatened abortion on 02/08/2022 EXAM: OBSTETRIC <14 WK [place] AND TRANSVAGINAL OB US TECHNIQUE: Both transabdominal and transvaginal ultrasound examinations were performed for complete evaluation of the gestation as well as the maternal uterus, adnexal regions, and pelvic cul-de-sac. Transvaginal technique was performed to assess early pregnancy. COMPARISON:  None Available. FINDINGS: Intrauterine gestational sac: None Yolk sac:  Not Visualized. Embryo:  Not Visualized. Cardiac Activity: Not Visualized. Heart Rate: NA  bpm Subchorionic hemorrhage:  None visualized. Maternal uterus/adnexae: Right ovary: Normal Left ovary: Normal Other :The endometrium appears heterogeneous measuring 10 mm in thickness. No significant increased vascularity. Free fluid:  Trace free fluid noted within the pelvis. IMPRESSION: 1. No intrauterine gestational sac, yolk sac,  or fetal pole identified. Differential considerations include intrauterine pregnancy too early to be sonographically visualized, missed abortion, or ectopic pregnancy. Followup ultrasound is recommended in 10-14 days for further evaluation. 2. The endometrium appears heterogeneous measuring 10 mm in thickness. Retained products of conception can be suspected on ultrasound if the endometrial thickness is >10 mm following spontaneous abortion. Clinical correlation is advised. Electronically Signed   By: Signa Kell M.D.   On: 02/18/2022 15:14    Procedures Procedures   Medications Ordered in ED Medications  misoprostol (CYTOTEC) tablet 800 mcg (800 mcg Vaginal Given 02/18/22 1341)  ketorolac (TORADOL) 15 MG/ML injection 15 mg (15 mg Intramuscular Given 02/18/22 1341)    ED Course/ Medical Decision Making/ A&P Clinical Course as of 02/18/22 1222  Mon Feb 18, 2022  1215 Bacteria, UA(!): RARE [MR]    Clinical Course User Index [MR] Shiah Berhow, Gabriel Cirri, PA-C                           Medical Decision Making Amount and/or Complexity of Data Reviewed Labs: ordered. Decision-making details documented in ED Course. Radiology: ordered.  Risk Prescription drug management.   27 year old female seen today with concern of a potential STD and vaginal bleeding after taking the metoprolol pills on 1/6.  Differential includes but is not limited to incomplete pregnancy termination, BV, STD, yeast, UTI.   This is not an exhaustive differential.    Past Medical History / Co-morbidities / Social History: Recent pregnancy termination with mifepristone and misoprostol   Additional history: Patient reports being around 6 weeks and 4 days pregnant.  Had an IUP confirmed via ultrasound  We are unable to type and screen in the emergency department however per chart review patient is O+   Physical Exam: Pertinent physical exam findings include Nontender abdomen  Lab Tests: I ordered, and personally  interpreted labs.  The pertinent results include: Urinalysis appears contaminated from vaginal bleeding hCG 447, expected this to be low secondary to pregnancy termination   Imaging Studies: I ordered and independently visualized and interpreted pelvic ultrasound and I agree with the radiologist that there appears to be a thickened endometrium.  Radiologist notes that there is potential for a poorly visualized early pregnancy versus ectopic pregnancy.  Patient is not having any pain, very low suspicion ectopic.  Believes it is more likely that she has had an incomplete abortion.  Medications: Toradol and intravaginal misoprostol.  She reports Toradol resolved her pain   Consultations Obtained: I spoke with Dr. Alysia Penna with OB/GYN and he recommends 800 mg misoprostol vaginally and follow-up with the clinic who originally did her procedure.  MDM/Disposition: This is a 27 year old female who presented today due to vaginal bleeding and concern for potential infection.  Urinalysis contaminated  with blood.  Wet prep without trichomonas, yeast or BV.  Pregnancy urine was positive and quantitative was 447.  I suspect that this is downtrending secondary to her pregnancy termination however after speaking with OB/GYN they suggest another intravaginal dose of misoprostol.  Pelvic ultrasound showed potential early pregnancy versus incomplete abortion versus undetected ectopic.  I suspect it is most likely an incomplete abortion that hopefully will resolve with the final dose of misoprostol that she has been given today.  Ultimately she will need to follow-up with the clinic who performed her procedure for further evaluation.  She has an appointment with them on Friday.  She is hemodynamically stable.  Normal vital signs, do not believe we need to check for severe anemia resulting in need for blood transfusion.  Vaginal bleeding is mild and she reports going through around 4 pads daily, wearing 1 for around 3 hours.   She will be discharged in stable condition with outpatient follow-up as planned with the clinic this Friday.    Final Clinical Impression(s) / ED Diagnoses Final diagnoses:  Vaginal bleeding    Rx / DC Orders ED Discharge Orders     None      Results and diagnoses were explained to the patient. Return precautions discussed in full. Patient had no additional questions and expressed complete understanding.   This chart was dictated using voice recognition software.  Despite best efforts to proofread,  errors can occur which can change the documentation meaning.    Rhae Hammock, PA-C 02/18/22 [place], [place], DO 02/19/22 [phone]
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
## 2022-02-18 NOTE — Discharge Instructions (Addendum)
You were given a second dose of the miso processed all.  Please call and make an appointment with the clinic that did your original procedure.  If you are unable to get in with them or need a normal OB/GYN, there is somebody attached to these discharge papers.  Additionally, it is appropriate for you to present to the maternal unit at [facility] for any further concerns.  It was a pleasure to meet you and we hope you feel better.  Follow-up on your chlamydia and gonorrhea results online.

## 2022-02-19 LAB — GC/CHLAMYDIA PROBE AMP (~~LOC~~) NOT AT ARMC
Chlamydia: NEGATIVE
Comment: NEGATIVE
Comment: NORMAL
Neisseria Gonorrhea: NEGATIVE

## 2022-04-17 ENCOUNTER — Encounter: Payer: Self-pay | Admitting: General Practice

## 2022-04-17 ENCOUNTER — Ambulatory Visit: Payer: Self-pay | Admitting: Family Medicine

## 2022-06-26 ENCOUNTER — Emergency Department (HOSPITAL_BASED_OUTPATIENT_CLINIC_OR_DEPARTMENT_OTHER): Payer: BLUE CROSS/BLUE SHIELD

## 2022-06-26 ENCOUNTER — Encounter (HOSPITAL_BASED_OUTPATIENT_CLINIC_OR_DEPARTMENT_OTHER): Payer: Self-pay | Admitting: Emergency Medicine

## 2022-06-26 ENCOUNTER — Other Ambulatory Visit: Payer: Self-pay

## 2022-06-26 ENCOUNTER — Emergency Department (HOSPITAL_BASED_OUTPATIENT_CLINIC_OR_DEPARTMENT_OTHER)
Admission: EM | Admit: 2022-06-26 | Discharge: 2022-06-26 | Disposition: A | Discharge status: Home / Self Care | Payer: BLUE CROSS/BLUE SHIELD | Attending: Emergency Medicine | Admitting: Emergency Medicine

## 2022-06-26 DIAGNOSIS — Z3491 Encounter for supervision of normal pregnancy, unspecified, first trimester: Secondary | ICD-10-CM

## 2022-06-26 DIAGNOSIS — Z3A00 Weeks of gestation of pregnancy not specified: Secondary | ICD-10-CM

## 2022-06-26 DIAGNOSIS — N83202 Unspecified ovarian cyst, left side: Secondary | ICD-10-CM | POA: Diagnosis not present

## 2022-06-26 DIAGNOSIS — O3481 Maternal care for other abnormalities of pelvic organs, first trimester: Secondary | ICD-10-CM | POA: Diagnosis not present

## 2022-06-26 DIAGNOSIS — O3680X0 Pregnancy with inconclusive fetal viability, not applicable or unspecified: Secondary | ICD-10-CM | POA: Diagnosis not present

## 2022-06-26 DIAGNOSIS — O26891 Other specified pregnancy related conditions, first trimester: Secondary | ICD-10-CM | POA: Diagnosis present

## 2022-06-26 LAB — CBC
HCT: 39.2 % (ref 36.0–46.0)
Hemoglobin: 13.3 g/dL (ref 12.0–15.0)
MCH: 29 pg (ref 26.0–34.0)
MCHC: 33.9 g/dL (ref 30.0–36.0)
MCV: 85.6 fL (ref 80.0–100.0)
Platelets: 291 10*3/uL (ref 150–400)
RBC: 4.58 MIL/uL (ref 3.87–5.11)
RDW: 13.3 % (ref 11.5–15.5)
WBC: 8 10*3/uL (ref 4.0–10.5)
nRBC: 0 % (ref 0.0–0.2)

## 2022-06-26 LAB — HCG, QUANTITATIVE, PREGNANCY: hCG, Beta Chain, Quant, S: 320 m[IU]/mL — ABNORMAL HIGH (ref <5)

## 2022-06-26 LAB — PREGNANCY, URINE: Preg Test, Ur: POSITIVE — AB

## 2022-06-26 NOTE — ED Triage Notes (Signed)
Pt arrives pov, steady gait with c/o vaginal bleeding and presumptive pregnancy, home tests positive x 3. Endorses  lower ABD cramping.

## 2022-06-26 NOTE — Discharge Instructions (Signed)
You need to follow-up on Friday to get your pregnancy hormone rechecked.  If you start having heavy bleeding going through more than a pad an hour, severe pain or passing out you should go directly to [facility].

## 2022-06-26 NOTE — ED Provider Notes (Signed)
[place] EMERGENCY DEPARTMENT AT [facility] Provider Note   CSN: [id number] Arrival date & time: 06/26/22  1725     History  Chief Complaint  Patient presents with   Vaginal Bleeding    Felicia Rodriguez is a 27 y.o. female.  Patient is a G3, P0 who has had 2 elective abortions presenting today with vaginal bleeding and a positive pregnancy test.  Patient was last seen by her OB/GYN on April 16.  At that time she had a negative pregnancy test and she was treated with an antibiotic for BV.  She normally does take birth control pills but reports that she stopped taking them when she was taking the antibiotic.  She has been sexually active and not using protection and noted a normal period at the beginning of May with heavy bleeding and then she reports on the 15th she started having spotting and bleeding again.  She was at the beach and because she had been having unprotected sex she took a Plan B on Sunday but when she got back from the beach on Monday she checked a pregnancy test and she had 3 test that were positive.  3 days ago she had severe pain in her right lower quadrant which has improved but is still uncomfortable.  With the persistent bleeding and positive pregnancy test she came for further evaluation.  She denies any passing out or urinary complaints at this time.  She is sexually active with only 1 partner.  The history is provided by the patient.  Vaginal Bleeding      Home Medications Prior to Admission medications   Not on File      Allergies    Shellfish allergy and Doxycycline    Review of Systems   Review of Systems  Genitourinary:  Positive for vaginal bleeding.    Physical Exam Updated Vital Signs BP 115/67   Pulse 70   Temp 98 F (36.7 C)   Resp 20   Wt 34 kg   LMP 06/07/2022 Comment: bleeding started again 5/15  SpO2 100%   BMI 11.75 kg/m  Physical Exam Vitals and nursing note reviewed.  Constitutional:      General: She is not in  acute distress.    Appearance: She is well-developed.  HENT:     Head: Normocephalic and atraumatic.  Eyes:     Pupils: Pupils are equal, round, and reactive to light.  Cardiovascular:     Rate and Rhythm: Normal rate and regular rhythm.     Heart sounds: Normal heart sounds. No murmur heard.    No friction rub.  Pulmonary:     Effort: Pulmonary effort is normal.     Breath sounds: Normal breath sounds. No wheezing or rales.  Abdominal:     General: Bowel sounds are normal. There is no distension.     Palpations: Abdomen is soft.     Tenderness: There is no abdominal tenderness. There is no guarding or rebound.       Comments: Right pelvic pain  Musculoskeletal:        General: No tenderness. Normal range of motion.     Comments: No edema  Skin:    General: Skin is warm and dry.     Findings: No rash.  Neurological:     Mental Status: She is alert and oriented to person, place, and time.     Cranial Nerves: No cranial nerve deficit.  Psychiatric:        Behavior: Behavior  normal.     ED Results / Procedures / Treatments   Labs (all labs ordered are listed, but only abnormal results are displayed) Labs Reviewed  PREGNANCY, URINE - Abnormal; Notable for the following components:      Result Value   Preg Test, Ur POSITIVE (*)    All other components within normal limits  HCG, QUANTITATIVE, PREGNANCY - Abnormal; Notable for the following components:   hCG, Beta Chain, Quant, S 320 (*)    All other components within normal limits  CBC    EKG None  Radiology [place] Art/Ven Flow Abd Pelv Doppler  Result Date: 06/26/2022 CLINICAL DATA:  Vaginal bleeding and right lower quadrant pain. Beta HCG 320. LMP 06/07/2022 EXAM: OBSTETRIC <14 WK [place] AND TRANSVAGINAL OB US DOPPLER ULTRASOUND OF OVARIES TECHNIQUE: Both transabdominal and transvaginal ultrasound examinations were performed for complete evaluation of the gestation as well as the maternal uterus, adnexal regions, and pelvic  cul-de-sac. Transvaginal technique was performed to assess early pregnancy. Color and duplex Doppler ultrasound was utilized to evaluate blood flow to the ovaries. COMPARISON:  None Available. FINDINGS: Intrauterine gestational sac: None Yolk sac:  Not Visualized. Embryo:  Not Visualized. Cardiac Activity: Not Visualized. Heart Rate: Not applicable bpm MSD: Not applicable Subchorionic hemorrhage:  Not applicable Maternal uterus/adnexae: No intrauterine gestational sac is visualized. Endometrial thickness 4 mm. Unremarkable appearance of the right ovary. Thin-walled cystic lesion in the left ovary measuring 3.0 x 3.3 x 3.4 cm. Moderate amount of free fluid in the pelvis containing some internal echogenicity. Pulsed Doppler evaluation of both ovaries demonstrates normal appearing low-resistance arterial and venous waveforms. IMPRESSION: 1. No intrauterine pregnancy is visualized. In the setting of positive pregnancy test, this reflects a pregnancy of unknown location. Differential considerations include early normal IUP, abnormal IUP, or nonvisualized ectopic pregnancy. Differentiation is achieved with serial beta HCG supplemented by repeat sonography as clinically warranted. 2. 3.4 cm thin-walled cystic structure in the left ovary with moderate volume free fluid in the pelvis. This does not have a typical appearance for ectopic pregnancy is favored to represent a hemorrhagic cyst. Ectopic pregnancy is difficult to exclude however and close follow-up is recommended. These results were called by telephone at the time of interpretation on 06/26/2022 at 10:17 pm to provider [facility] , who verbally acknowledged these results. Electronically Signed   By: Minerva Fester M.D.   On: 06/26/2022 22:18   US OB LESS THAN 14 WEEKS WITH OB TRANSVAGINAL  Result Date: 06/26/2022 CLINICAL DATA:  Vaginal bleeding and right lower quadrant pain. Beta HCG 320. LMP 06/07/2022 EXAM: OBSTETRIC <14 WK [place] AND TRANSVAGINAL OB US  DOPPLER ULTRASOUND OF OVARIES TECHNIQUE: Both transabdominal and transvaginal ultrasound examinations were performed for complete evaluation of the gestation as well as the maternal uterus, adnexal regions, and pelvic cul-de-sac. Transvaginal technique was performed to assess early pregnancy. Color and duplex Doppler ultrasound was utilized to evaluate blood flow to the ovaries. COMPARISON:  None Available. FINDINGS: Intrauterine gestational sac: None Yolk sac:  Not Visualized. Embryo:  Not Visualized. Cardiac Activity: Not Visualized. Heart Rate: Not applicable bpm MSD: Not applicable Subchorionic hemorrhage:  Not applicable Maternal uterus/adnexae: No intrauterine gestational sac is visualized. Endometrial thickness 4 mm. Unremarkable appearance of the right ovary. Thin-walled cystic lesion in the left ovary measuring 3.0 x 3.3 x 3.4 cm. Moderate amount of free fluid in the pelvis containing some internal echogenicity. Pulsed Doppler evaluation of both ovaries demonstrates normal appearing low-resistance arterial and venous waveforms. IMPRESSION: 1. No  intrauterine pregnancy is visualized. In the setting of positive pregnancy test, this reflects a pregnancy of unknown location. Differential considerations include early normal IUP, abnormal IUP, or nonvisualized ectopic pregnancy. Differentiation is achieved with serial beta HCG supplemented by repeat sonography as clinically warranted. 2. 3.4 cm thin-walled cystic structure in the left ovary with moderate volume free fluid in the pelvis. This does not have a typical appearance for ectopic pregnancy is favored to represent a hemorrhagic cyst. Ectopic pregnancy is difficult to exclude however and close follow-up is recommended. These results were called by telephone at the time of interpretation on 06/26/2022 at 10:17 pm to provider [facility] , who verbally acknowledged these results. Electronically Signed   By: Minerva Fester M.D.   On: 06/26/2022 22:18     Procedures Procedures    Medications Ordered in ED Medications - No data to display  ED Course/ Medical Decision Making/ A&P                             Medical Decision Making Amount and/or Complexity of Data Reviewed External Data Reviewed: notes. Labs: ordered. Decision-making details documented in ED Course. Radiology: ordered and independent interpretation performed. Decision-making details documented in ED Course.   Pt presenting today with a complaint that caries a high risk for morbidity and mortality.  Here with vaginal bleeding and positive pregnancy test.  She does have pain in the right lower quadrant and concern for ectopic versus spontaneous abortion versus bleeding in first trimester.  I independently interpreted patient's labs and urine pregnancy test is positive, CBC with within normal limits.  Vital signs are normal.  hCG and pelvic ultrasound are pending  10:55 PM hCG is mildly elevated at 320.  I have independently visualized and interpreted pt's images today.  Pelvic ultrasound with abnormalities of the left ovary.  Radiology reports that there is no intrauterine pregnancy visualized and patient has a 3.4 cm thin-walled cystic structure in the left ovary with moderate volume free fluid in the pelvis which is more typical for hemorrhagic cyst and not an ectopic pregnancy.  Spoke with Dr. Berton Lan who is on-call for [facility] today and discussed the patient's case.  Based on patient's negative pregnancy test on April 16 and normal menses at the beginning of the month suspect she is only 1 to [redacted] weeks pregnant making ectopic rupture very unlikely.  Patient is otherwise well-appearing with normal vital signs.  At this time feel that is reasonable to allow patient to go home and they will call her for return in 48 hours for repeat hCG testing.  She typically sees women's at [facility] and they will contact her but also she was instructed to call them if she does not hear anything  tomorrow.  She was also given strict return precautions.  She is comfortable with this plan and appears stable for discharge.          Final Clinical Impression(s) / ED Diagnoses Final diagnoses:  First trimester pregnancy  Hemorrhagic cyst of left ovary    Rx / DC Orders ED Discharge Orders     None         Gwyneth Sprout, MD 06/26/22 2259

## 2022-06-26 NOTE — Consult Note (Signed)
   OB/GYN Telephone Consult  06/26/2022   Felicia Rodriguez is a 27 y.o. G3P0020 who is currently {pregnant not pregnant:26764} presenting to {[facility]:26765}.   I was called for a consult regarding the care of this patient by the {Provider Credentials:26766} caring for the patient.   The provider had the following clinical question: ***  The provider presented the following relevant clinical information: ***  I performed a chart review on the patient and reviewed available documentation.  BP 124/79   Pulse 75   Temp 98 F (36.7 C)   Resp 18   Wt 34 kg   LMP 06/07/2022 Comment: bleeding started again 5/15  SpO2 100%   BMI 11.75 kg/m   Exam- performed by consulting provider   Recommendations:  -*** -*** -*** -Recommended MD/APP provide the patient with a referral to the [facility] (any office) for follow up in  {1-6 weeks:20341::"1 week"}.   Thank you for this consult and if additional recommendations are needed please call [phone] for the OB/GYN attending on service at [facility].   I spent approximately {TIME; 0 MIN TO 60 MIN:[phone]} directly consulting with the provider and verbally discussing this case. Additionally {TIME; 0 MIN TO 60 MIN:[phone]} minutes was spent performing chart review and documentation.    Lennart Pall, MD   Criteria for phone consult billing? (If answer to any of these are yes then you cannot bill this telephone consult) Will the patient be seen urgently (within 24hrs) at a [facility] practice? {yes/no:20286} Is this a patient on which I performed surgery within the last 7d? {yes/no:20286} Have you billed a telephone consult on this patient in the last 7d? {yes/no:20286}  *** send message to [facility] Billing and Coding Pool to assure this is billed appropriately  CPT Code (based on total time, direct and indirect) Interprofessional Telephone/ Internet/ Electronic Health Record consultations:   99446  (5-23minutes) 99447 (11-12 minutes) 99448 (21-30 minutes), 99449 (31 minutes or more)

## 2022-06-28 ENCOUNTER — Other Ambulatory Visit (HOSPITAL_BASED_OUTPATIENT_CLINIC_OR_DEPARTMENT_OTHER): Payer: BLUE CROSS/BLUE SHIELD

## 2022-06-28 DIAGNOSIS — O200 Threatened abortion: Secondary | ICD-10-CM

## 2022-06-29 LAB — BETA HCG QUANT (REF LAB): hCG Quant: 289 m[IU]/mL

## 2022-07-02 ENCOUNTER — Inpatient Hospital Stay (HOSPITAL_COMMUNITY)
Admission: AD | Admit: 2022-07-02 | Discharge: 2022-07-02 | Disposition: A | Discharge status: Home / Self Care | Payer: BLUE CROSS/BLUE SHIELD | Attending: Obstetrics and Gynecology | Admitting: Obstetrics and Gynecology

## 2022-07-02 ENCOUNTER — Encounter (HOSPITAL_COMMUNITY): Payer: Self-pay | Admitting: *Deleted

## 2022-07-02 DIAGNOSIS — N83201 Unspecified ovarian cyst, right side: Secondary | ICD-10-CM | POA: Diagnosis not present

## 2022-07-02 DIAGNOSIS — R1031 Right lower quadrant pain: Secondary | ICD-10-CM | POA: Diagnosis not present

## 2022-07-02 DIAGNOSIS — O039 Complete or unspecified spontaneous abortion without complication: Secondary | ICD-10-CM | POA: Diagnosis present

## 2022-07-02 DIAGNOSIS — N83209 Unspecified ovarian cyst, unspecified side: Secondary | ICD-10-CM

## 2022-07-02 LAB — URINALYSIS, ROUTINE W REFLEX MICROSCOPIC
Bilirubin Urine: NEGATIVE
Glucose, UA: NEGATIVE mg/dL
Ketones, ur: 5 mg/dL — AB
Nitrite: NEGATIVE
Protein, ur: 30 mg/dL — AB
Specific Gravity, Urine: 1.024 (ref 1.005–1.030)
pH: 5 (ref 5.0–8.0)

## 2022-07-02 LAB — COMPREHENSIVE METABOLIC PANEL
ALT: 16 U/L (ref 0–44)
AST: 20 U/L (ref 15–41)
Albumin: 4 g/dL (ref 3.5–5.0)
Alkaline Phosphatase: 44 U/L (ref 38–126)
Anion gap: 9 (ref 5–15)
BUN: 7 mg/dL (ref 6–20)
CO2: 23 mmol/L (ref 22–32)
Calcium: 9.4 mg/dL (ref 8.9–10.3)
Chloride: 107 mmol/L (ref 98–111)
Creatinine, Ser: 0.95 mg/dL (ref 0.44–1.00)
GFR, Estimated: >60 mL/min (ref >60)
Glucose, Bld: 93 mg/dL (ref 70–99)
Potassium: 3.4 mmol/L — ABNORMAL LOW (ref 3.5–5.1)
Sodium: 139 mmol/L (ref 135–145)
Total Bilirubin: 1.1 mg/dL (ref 0.3–1.2)
Total Protein: 6.9 g/dL (ref 6.5–8.1)

## 2022-07-02 LAB — HCG, QUANTITATIVE, PREGNANCY: hCG, Beta Chain, Quant, S: 196 m[IU]/mL — ABNORMAL HIGH (ref <5)

## 2022-07-02 LAB — CBC
HCT: 37.8 % (ref 36.0–46.0)
Hemoglobin: 12.8 g/dL (ref 12.0–15.0)
MCH: 29.6 pg (ref 26.0–34.0)
MCHC: 33.9 g/dL (ref 30.0–36.0)
MCV: 87.3 fL (ref 80.0–100.0)
Platelets: 330 10*3/uL (ref 150–400)
RBC: 4.33 MIL/uL (ref 3.87–5.11)
RDW: 13.3 % (ref 11.5–15.5)
WBC: 10.3 10*3/uL (ref 4.0–10.5)
nRBC: 0 % (ref 0.0–0.2)

## 2022-07-02 MED ORDER — CYCLOBENZAPRINE HCL 10 MG PO TABS: 10.0000 mg | ORAL_TABLET | Freq: Two times a day (BID) | ORAL | 0 refills | Status: AC | PRN

## 2022-07-02 MED ORDER — CYCLOBENZAPRINE HCL 5 MG PO TABS
10.0000 mg | ORAL_TABLET | Freq: Once | ORAL | Status: AC
  Administered 2022-07-02: 10 mg via ORAL
  Filled 2022-07-02: qty 2

## 2022-07-02 NOTE — Progress Notes (Signed)
Jessica Emly CNM in earlier to see pt and discuss d/c plan. Written and verbal d/c instructions given and understanding voiced.  

## 2022-07-02 NOTE — MAU Note (Addendum)
.  Felicia Rodriguez is a 27 y.o. at Unknown here in MAU reporting being told she had a R ovarian cyst rupture when she went to [facility] last wk. Conts to have RLQ pain, vag bleeding (uses 3 panty liners daily). States her BHCG levels are falling. She just wants to know what's going on and what she is to do from here. States she is miserable. Alternates Tylenol and ibuprofen for pain since last wk LMP: 06/07/22 Onset of complaint: last wk Pain score: 4 Vitals:   07/02/22 2139 07/02/22 2141  BP:  108/67  Pulse: 79   Resp: 17   Temp: 98.2 F (36.8 C)   SpO2: 100%      FHT:n/a Lab orders placed from triage:  u/a

## 2022-07-02 NOTE — MAU Provider Note (Signed)
History     CSN: [id number]  Arrival date and time: 07/02/22 2104   Event Date/Time   First Provider Initiated Contact with Patient 07/02/22 2245      Chief Complaint  Patient presents with   Abdominal Pain   Back Pain   Felicia Rodriguez is a 27 y.o. [id number] who receives care at [facility].  She presents today for pain associated with known ovarian cyst.  Patient states she has a right ovarian cyst that ruptured on 5/22.  She states she has had RLQ pain has been constant and managed with Tylenol and ibuprofen.  She describes the pain as cramping that is sometimes sharp and rates it a 3/10.  She states this is manageable and feels like she can tolerate it, but does desire pain medication.  She also reports vaginal bleeding that has been "nonstop since the 15th."  However patient reports bleeding is starting to be lightening up and become darker in color.  She states she has been seen at [facility] for this issue, but has no follow-up scheduled.  Patient also reports she is having a miscarriage as her previous hCG level has declined.   OB History     Gravida  4   Para  0   Term  0   Preterm  0   AB  2   Living  0      SAB  0   IAB  0   Ectopic  0   Multiple  0   Live Births  0           Past Medical History:  Diagnosis Date   History of chlamydia     No past surgical history on file.  No family history on file.  Social History   Tobacco Use   Smoking status: Never   Smokeless tobacco: Never  Vaping Use   Vaping Use: Every day  Substance Use Topics   Alcohol use: No   Drug use: No    Allergies:  Allergies  Allergen Reactions   Shellfish Allergy    Doxycycline Rash    No medications prior to admission.    Review of Systems  Gastrointestinal:  Positive for abdominal pain.  Genitourinary:  Positive for vaginal bleeding.   Physical Exam   Blood pressure 108/67, pulse 79, temperature 98.2 F (36.8 C), resp. rate 17, height 5\' 7"  (1.702  m), weight 79.3 kg, last menstrual period 06/07/2022, SpO2 100 %.  Physical Exam Constitutional:      General: She is not in acute distress.    Appearance: She is well-developed. She is diaphoretic.  HENT:     Head: Normocephalic and atraumatic.  Eyes:     Conjunctiva/sclera: Conjunctivae normal.  Cardiovascular:     Rate and Rhythm: Normal rate.  Pulmonary:     Effort: Pulmonary effort is normal. No respiratory distress.  Musculoskeletal:        General: Normal range of motion.     Cervical back: Normal range of motion.  Neurological:     Mental Status: She is alert and oriented to person, place, and time.  Psychiatric:        Mood and Affect: Mood normal.        Behavior: Behavior normal.     MAU Course  Procedures Results for orders placed or performed during the hospital encounter of 07/02/22 (from the past 24 hour(s))  Urinalysis, Routine w reflex microscopic -Urine, Clean Catch     Status: Abnormal   Collection  Time: 07/02/22 10:14 PM  Result Value Ref Range   Color, Urine YELLOW YELLOW   APPearance HAZY (A) CLEAR   Specific Gravity, Urine 1.024 1.005 - 1.030   pH 5.0 5.0 - 8.0   Glucose, UA NEGATIVE NEGATIVE mg/dL   Hgb urine dipstick MODERATE (A) NEGATIVE   Bilirubin Urine NEGATIVE NEGATIVE   Ketones, ur 5 (A) NEGATIVE mg/dL   Protein, ur 30 (A) NEGATIVE mg/dL   Nitrite NEGATIVE NEGATIVE   Leukocytes,Ua TRACE (A) NEGATIVE   RBC / HPF 0-5 0 - 5 RBC/hpf   WBC, UA 6-10 0 - 5 WBC/hpf   Bacteria, UA RARE (A) NONE SEEN   Squamous Epithelial / HPF 0-5 0 - 5 /HPF   Mucus PRESENT   CBC     Status: None   Collection Time: 07/02/22 10:42 PM  Result Value Ref Range   WBC 10.3 4.0 - 10.5 K/uL   RBC 4.33 3.87 - 5.11 MIL/uL   Hemoglobin 12.8 12.0 - 15.0 g/dL   HCT 16.1 09.6 - 04.5 %   MCV 87.3 80.0 - 100.0 fL   MCH 29.6 26.0 - 34.0 pg   MCHC 33.9 30.0 - 36.0 g/dL   RDW 40.9 81.1 - 91.4 %   Platelets 330 150 - 400 K/uL   nRBC 0.0 0.0 - 0.2 %  Comprehensive  metabolic panel     Status: Abnormal   Collection Time: 07/02/22 10:42 PM  Result Value Ref Range   Sodium 139 135 - 145 mmol/L   Potassium 3.4 (L) 3.5 - 5.1 mmol/L   Chloride 107 98 - 111 mmol/L   CO2 23 22 - 32 mmol/L   Glucose, Bld 93 70 - 99 mg/dL   BUN 7 6 - 20 mg/dL   Creatinine, Ser 7.82 0.44 - 1.00 mg/dL   Calcium 9.4 8.9 - 95.6 mg/dL   Total Protein 6.9 6.5 - 8.1 g/dL   Albumin 4.0 3.5 - 5.0 g/dL   AST 20 15 - 41 U/L   ALT 16 0 - 44 U/L   Alkaline Phosphatase 44 38 - 126 U/L   Total Bilirubin 1.1 0.3 - 1.2 mg/dL   GFR, Estimated >21 >30 mL/min   Anion gap 9 5 - 15    MDM Labs: UA, CBC, CMP, hCG Muscle Relaxant Prescription Coordination of F/U Care Assessment and Plan  27 year old Miscarriage Right Ovarian Cyst   -POC Reviewed. -Informed that labs would be drawn. -Informed that pelvic US would not be completed today, but provider will send message to [facility] for scheduling in next 2 weeks. -Reviewed pain and management at home. Reassured okay to take tylenol and ibuprofen. -Patient offered and accepts pain medication. Will give flexeril.  -Discussed discharging home, prior to results, and follow up via mychart for abnormal results. -Discussed returning back to MAU tonight or in early AM for results requiring immediate follow up.  Patient agreeable.  -Addressed concerns regarding D&C in setting of known miscarriage. Reassured not necessary as no IUGS noted.  Further informed that hCG today will evaluate levels for continued and appropriate decrease. -Precautions reviewed. -Rx for flexeril sent to pharmacy on file.  -Message sent to [facility] for follow up in 2 weeks.  -Encouraged to call primary office or return to MAU if symptoms worsen or with the onset of new symptoms. -Discharged to home in stable condition.  Cherre Robins 07/02/2022, 10:45 PM

## 2022-07-04 ENCOUNTER — Encounter (HOSPITAL_BASED_OUTPATIENT_CLINIC_OR_DEPARTMENT_OTHER): Payer: Self-pay | Admitting: Obstetrics & Gynecology

## 2022-07-04 ENCOUNTER — Ambulatory Visit (HOSPITAL_BASED_OUTPATIENT_CLINIC_OR_DEPARTMENT_OTHER): Payer: BLUE CROSS/BLUE SHIELD | Admitting: Obstetrics & Gynecology

## 2022-07-04 VITALS — BP 109/67 | HR 78 | Ht 67.0 in | Wt 175.2 lb

## 2022-07-04 DIAGNOSIS — O039 Complete or unspecified spontaneous abortion without complication: Secondary | ICD-10-CM | POA: Diagnosis not present

## 2022-07-04 DIAGNOSIS — Z3A00 Weeks of gestation of pregnancy not specified: Secondary | ICD-10-CM | POA: Diagnosis not present

## 2022-07-04 NOTE — Progress Notes (Signed)
GYNECOLOGY  VISIT  CC:   vaginal bleeding/miscarriage  HPI: 27 y.o. G38P0020 Single White or Caucasian female here for follow up after MAU visit.  Has been having vaginal bleeding with decreasing HCG levels that started 5/15.  She did take a pregnancy test on 5/20 and this was positive.  She came to the ER here at [facility] on 5/22 with the HCG level was 320.  Level has decreased to 298 and then to 196.  She was seen at the MAU on 5/28.  She is wondering about possible ectopic, does she need surgery, is this a miscarriage, and the cyst finding on ultrasound.  Reviewed ultrasound results.  Probable hemorrhagia 3.4cm left ovarian cyst noted.  Discussed with pt this will resolve on own.  Pain is better and bleeding is now light and just dark.  Miscarriage discussed.  Indications for surgery, medical management discussed.  Given lowering HCG levels and no significant finding within the endometrium, cytotec not indicated at this time.  Will follow up HCG levels down to normal.  Appt made for her for next Tuesday.   She reports having a normal pap and negative STD testing 4/16 at [facility].    MBT O+.     Past Medical History:  Diagnosis Date   History of chlamydia     MEDS:   Current Outpatient Medications on File Prior to Visit  Medication Sig Dispense Refill   cyclobenzaprine (FLEXERIL) 10 MG tablet Take 1 tablet (10 mg total) by mouth 2 (two) times daily as needed for muscle spasms. 10 tablet 0   No current facility-administered medications on file prior to visit.    ALLERGIES: Shellfish allergy and Doxycycline  SH:  non somker, single  Review of Systems  Constitutional: Negative.   Genitourinary:        Spotting    PHYSICAL EXAMINATION:    BP 109/67 (BP Location: Right Arm, Patient Position: Sitting, Cuff Size: Large)   Pulse 78   Ht 5\' 7"  (1.702 m) Comment: Reported  Wt 175 lb 3.2 oz (79.5 kg)   LMP 06/07/2022 Comment: bleeding started again 5/15  BMI 27.44 kg/m      Physical Exam Constitutional:      Appearance: Normal appearance.  Cardiovascular:     Rate and Rhythm: Normal rate.  Pulmonary:     Effort: Pulmonary effort is normal.  Neurological:     General: No focal deficit present.     Mental Status: She is alert.  Psychiatric:        Mood and Affect: Mood normal.     Assessment/Plan: 1. Miscarriage - Beta hCG quant (ref lab); Future  - will follow hCG levels down to < 5.  Weekly testing will be done.  Lab placed for next week and appt made for pt.  Importance of follow up with discussed.  Precautions given for returning to MAU.  Questions answered.

## 2022-07-08 ENCOUNTER — Encounter (HOSPITAL_BASED_OUTPATIENT_CLINIC_OR_DEPARTMENT_OTHER): Payer: Self-pay | Admitting: *Deleted

## 2022-07-09 ENCOUNTER — Other Ambulatory Visit (HOSPITAL_BASED_OUTPATIENT_CLINIC_OR_DEPARTMENT_OTHER): Payer: BLUE CROSS/BLUE SHIELD

## 2022-07-09 ENCOUNTER — Encounter (HOSPITAL_BASED_OUTPATIENT_CLINIC_OR_DEPARTMENT_OTHER): Payer: Self-pay

## 2022-07-09 ENCOUNTER — Other Ambulatory Visit (HOSPITAL_COMMUNITY)
Admission: RE | Admit: 2022-07-09 | Discharge: 2022-07-09 | Disposition: A | Discharge status: Home / Self Care | Payer: BLUE CROSS/BLUE SHIELD | Source: Ambulatory Visit | Attending: Obstetrics & Gynecology | Admitting: Obstetrics & Gynecology

## 2022-07-09 ENCOUNTER — Ambulatory Visit (INDEPENDENT_AMBULATORY_CARE_PROVIDER_SITE_OTHER): Payer: BLUE CROSS/BLUE SHIELD

## 2022-07-09 DIAGNOSIS — Z113 Encounter for screening for infections with a predominantly sexual mode of transmission: Secondary | ICD-10-CM | POA: Insufficient documentation

## 2022-07-09 DIAGNOSIS — O039 Complete or unspecified spontaneous abortion without complication: Secondary | ICD-10-CM

## 2022-07-09 NOTE — Progress Notes (Signed)
Patient came in today to have blood drawn and to give aptima self swab for std testing. tbw

## 2022-07-09 NOTE — Progress Notes (Signed)
Patient came in today for blood work and self swab std testing. Aptima swab has been charged on the lab appointment. tbw

## 2022-07-10 LAB — CERVICOVAGINAL ANCILLARY ONLY
Chlamydia: NEGATIVE
Comment: NEGATIVE
Comment: NEGATIVE
Comment: NORMAL
Neisseria Gonorrhea: NEGATIVE
Trichomonas: NEGATIVE

## 2022-07-10 LAB — BETA HCG QUANT (REF LAB): hCG Quant: 54 m[IU]/mL

## 2022-07-10 LAB — RPR+HBSAG+HIV
HIV Screen 4th Generation wRfx: NONREACTIVE
Hepatitis B Surface Ag: NEGATIVE
RPR Ser Ql: NONREACTIVE

## 2022-07-10 LAB — HEPATITIS C ANTIBODY: Hep C Virus Ab: NONREACTIVE

## 2022-07-30 ENCOUNTER — Other Ambulatory Visit (HOSPITAL_BASED_OUTPATIENT_CLINIC_OR_DEPARTMENT_OTHER): Payer: BLUE CROSS/BLUE SHIELD

## 2022-07-30 DIAGNOSIS — O039 Complete or unspecified spontaneous abortion without complication: Secondary | ICD-10-CM

## 2022-07-31 LAB — BETA HCG QUANT (REF LAB): hCG Quant: <1 m[IU]/mL

## 2023-03-18 ENCOUNTER — Ambulatory Visit (INDEPENDENT_AMBULATORY_CARE_PROVIDER_SITE_OTHER): Payer: BLUE CROSS/BLUE SHIELD | Admitting: Certified Nurse Midwife

## 2023-03-18 ENCOUNTER — Other Ambulatory Visit (HOSPITAL_COMMUNITY)
Admission: RE | Admit: 2023-03-18 | Discharge: 2023-03-18 | Disposition: A | Discharge status: Home / Self Care | Payer: BLUE CROSS/BLUE SHIELD | Source: Ambulatory Visit | Attending: Certified Nurse Midwife | Admitting: Certified Nurse Midwife

## 2023-03-18 ENCOUNTER — Encounter (HOSPITAL_BASED_OUTPATIENT_CLINIC_OR_DEPARTMENT_OTHER): Payer: Self-pay | Admitting: Certified Nurse Midwife

## 2023-03-18 VITALS — BP 121/94 | HR 74 | Ht 67.0 in | Wt 173.0 lb

## 2023-03-18 DIAGNOSIS — Z124 Encounter for screening for malignant neoplasm of cervix: Secondary | ICD-10-CM | POA: Diagnosis present

## 2023-03-18 DIAGNOSIS — N920 Excessive and frequent menstruation with regular cycle: Secondary | ICD-10-CM | POA: Diagnosis not present

## 2023-03-18 DIAGNOSIS — Z113 Encounter for screening for infections with a predominantly sexual mode of transmission: Secondary | ICD-10-CM | POA: Insufficient documentation

## 2023-03-18 DIAGNOSIS — Z1151 Encounter for screening for human papillomavirus (HPV): Secondary | ICD-10-CM | POA: Insufficient documentation

## 2023-03-18 MED ORDER — LEVONORGESTREL-ETHINYL ESTRAD 0.15-30 MG-MCG PO TABS: 1.0000 | ORAL_TABLET | Freq: Every day | ORAL | 5 refills | Status: DC

## 2023-03-18 NOTE — Progress Notes (Signed)
Subjective:     Felicia Rodriguez is a 28 y.o. female who presents for evaluation of heavy period. She is on COC and states periods are normally regular without menorrhagia or dysmenorrhea. She has been on this COC for several years. Her period started 3 days ago (on time) but has been much heavier than she has ever experienced before. She has passed some blood clots. She states bleeding seems to have slowed today, less than yesterday and Sunday. There is no dizziness, light-headedness or loss of consciousness. Pt does not think there is a chance of pregnancy. She was last sexually active a few months ago. She reports a history of ovarian cyst but denies hx of uterine fibroids.    The following portions of the patient's history were reviewed and updated as appropriate: allergies, current medications, past family history, past medical history, past social history, past surgical history, and problem list.   Review of Systems Pertinent items are noted in HPI.    Objective:    BP (!) 121/94 (BP Location: Left Arm, Patient Position: Sitting, Cuff Size: Normal)   Pulse 74   Ht 5\' 7"  (1.702 m)   Wt 173 lb (78.5 kg)   LMP 03/15/2023   Breastfeeding Unknown   BMI 27.10 kg/m  General appearance: alert, cooperative, and appears stated age Abdomen: soft, non-tender; bowel sounds normal; no masses,  no organomegaly Pelvic: cervix normal in appearance, external genitalia normal, no adnexal masses or tenderness, no cervical motion tenderness, rectovaginal septum normal, uterus normal size, shape, and consistency, vagina normal without discharge, and small amount blood noted in vagina from period    Assessment:    Menorrhagia.  Hx Ovarian Cyst   Plan:  Pap smear was due and pt desired pap smear today   CBC, TSH, Quant HCG (rule out miscarriage) Pt agreeable to changing COC at this time. She will monitor and RTO if menorrhagia persists. Pt would prefer GYN [place] to evaluate due to episode of menorrhagia  (rule out fibroids) and Hx Ovarian Cysts-scheduled.  Letta Kocher

## 2023-03-19 ENCOUNTER — Encounter (HOSPITAL_BASED_OUTPATIENT_CLINIC_OR_DEPARTMENT_OTHER): Payer: Self-pay | Admitting: Certified Nurse Midwife

## 2023-03-19 LAB — CBC
Hematocrit: 42 % (ref 34.0–46.6)
Hemoglobin: 14.1 g/dL (ref 11.1–15.9)
MCH: 29.5 pg (ref 26.6–33.0)
MCHC: 33.6 g/dL (ref 31.5–35.7)
MCV: 88 fL (ref 79–97)
Platelets: 300 10*3/uL (ref 150–450)
RBC: 4.78 x10E6/uL (ref 3.77–5.28)
RDW: 12.1 % (ref 11.7–15.4)
WBC: 5.7 10*3/uL (ref 3.4–10.8)

## 2023-03-19 LAB — TSH: TSH: 3.77 u[IU]/mL (ref 0.450–4.500)

## 2023-03-19 LAB — BETA HCG QUANT (REF LAB): hCG Quant: <1 m[IU]/mL

## 2023-03-24 LAB — CYTOLOGY - PAP
Chlamydia: NEGATIVE
Comment: NEGATIVE
Comment: NEGATIVE
Comment: NEGATIVE
Comment: NORMAL
Diagnosis: NEGATIVE
High risk HPV: NEGATIVE
Neisseria Gonorrhea: NEGATIVE
Trichomonas: NEGATIVE

## 2023-04-07 ENCOUNTER — Ambulatory Visit (HOSPITAL_BASED_OUTPATIENT_CLINIC_OR_DEPARTMENT_OTHER): Admission: RE | Admit: 2023-04-07 | Payer: BLUE CROSS/BLUE SHIELD | Source: Ambulatory Visit

## 2024-01-09 ENCOUNTER — Other Ambulatory Visit (HOSPITAL_BASED_OUTPATIENT_CLINIC_OR_DEPARTMENT_OTHER): Payer: Self-pay

## 2024-01-09 ENCOUNTER — Ambulatory Visit (HOSPITAL_BASED_OUTPATIENT_CLINIC_OR_DEPARTMENT_OTHER): Admitting: Certified Nurse Midwife

## 2024-01-09 ENCOUNTER — Encounter (HOSPITAL_BASED_OUTPATIENT_CLINIC_OR_DEPARTMENT_OTHER): Payer: Self-pay | Admitting: Certified Nurse Midwife

## 2024-01-09 ENCOUNTER — Other Ambulatory Visit (HOSPITAL_COMMUNITY)
Admission: RE | Admit: 2024-01-09 | Discharge: 2024-01-09 | Disposition: A | Source: Ambulatory Visit | Attending: Certified Nurse Midwife | Admitting: Certified Nurse Midwife

## 2024-01-09 VITALS — BP 97/46 | HR 59 | Ht 67.0 in | Wt 177.4 lb

## 2024-01-09 DIAGNOSIS — N920 Excessive and frequent menstruation with regular cycle: Secondary | ICD-10-CM | POA: Diagnosis not present

## 2024-01-09 DIAGNOSIS — Z881 Allergy status to other antibiotic agents status: Secondary | ICD-10-CM | POA: Diagnosis not present

## 2024-01-09 DIAGNOSIS — Z202 Contact with and (suspected) exposure to infections with a predominantly sexual mode of transmission: Secondary | ICD-10-CM | POA: Diagnosis not present

## 2024-01-09 DIAGNOSIS — R102 Pelvic and perineal pain unspecified side: Secondary | ICD-10-CM

## 2024-01-09 DIAGNOSIS — N83209 Unspecified ovarian cyst, unspecified side: Secondary | ICD-10-CM | POA: Insufficient documentation

## 2024-01-09 MED ORDER — AZITHROMYCIN 500 MG PO TABS
1000.0000 mg | ORAL_TABLET | Freq: Once | ORAL | 0 refills | Status: AC
  Filled 2024-01-09: qty 2, 1d supply, fill #0

## 2024-01-09 MED ORDER — FLUCONAZOLE 150 MG PO TABS
150.0000 mg | ORAL_TABLET | ORAL | 2 refills | Status: AC | PRN
  Filled 2024-01-09: qty 2, 4d supply, fill #0

## 2024-01-09 NOTE — Progress Notes (Signed)
 Subjective:     Felicia Rodriguez is a 28 y.o. female who presents for STD Screening. A partner has made her aware that he was exposed to Chlamydia. Pt reports feeling a sense of pelvic pressure. No vaginal discharge. No vaginal pain or lesions. Pt was evaluated 03/18/23 and Pelvic US  was ordered at that time. Pt states she still desires the US  but has not had a chance to schedule it. Due to menorrhagia, she would like the US  scheduled. She has not started the combination birth control pills yet (states she prefers to have US  first).    The following portions of the patient's history were reviewed and updated as appropriate: allergies, current medications, past family history, past medical history, past social history, past surgical history, and problem list.   Review of Systems Pertinent items are noted in HPI.    Objective:    BP (!) 97/46   Pulse (!) 59   Ht 5' 7 (1.702 m)   Wt 177 lb 6.4 oz (80.5 kg)   LMP 12/25/2023 (Approximate)   BMI 27.78 kg/m  General appearance: alert, cooperative, and appears stated age Pelvic: cervix normal in appearance, external genitalia normal, no adnexal masses or tenderness, no cervical motion tenderness, rectovaginal septum normal, uterus normal size, shape, and consistency, and vagina normal without discharge    Assessment:    Encounter for STD Screening.  Pelvic Pressure Menorrhagia   Plan:    Pt allergic to Doxycycline . Discussed Azithromycin  1gm orally in a single dose. Pt requests Rx for Diflucan  in the event that she develops vulvovaginal candidiasis after antibiotic therapy Urine Culture sent ( due to pelvic pressure). Cervicovaginal swab collected for GC/CT/TV/BV/yeast. RTO for US  as discussed 03/2023. Arland MARLA Roller

## 2024-01-12 ENCOUNTER — Ambulatory Visit (HOSPITAL_BASED_OUTPATIENT_CLINIC_OR_DEPARTMENT_OTHER): Payer: Self-pay | Admitting: Certified Nurse Midwife

## 2024-01-12 LAB — CERVICOVAGINAL ANCILLARY ONLY
Chlamydia: NEGATIVE
Comment: NEGATIVE
Comment: NEGATIVE
Comment: NORMAL
Neisseria Gonorrhea: NEGATIVE
Trichomonas: NEGATIVE

## 2024-01-13 ENCOUNTER — Telehealth (HOSPITAL_BASED_OUTPATIENT_CLINIC_OR_DEPARTMENT_OTHER): Payer: Self-pay

## 2024-01-13 LAB — URINE CULTURE

## 2024-01-13 MED ORDER — NITROFURANTOIN MONOHYD MACRO 100 MG PO CAPS: 100.0000 mg | ORAL_CAPSULE | Freq: Two times a day (BID) | ORAL | 0 refills | Status: AC

## 2024-01-13 NOTE — Telephone Encounter (Signed)
 Patient would like for someone to call her concerning her results.

## 2024-01-13 NOTE — Telephone Encounter (Signed)
 Patient sent a mychart message 01/13/2024. Results and recommendations sent via mychart to patient.

## 2024-02-24 NOTE — Progress Notes (Signed)
" ° °  Ultrasound f/u Patient name: Felicia Rodriguez MRN [id number]  Date of birth: 1996/01/14 Chief Complaint:   Follow-up  History of Present Illness:   Felicia Rodriguez is a 29 y.o. [id number] Caucasian female being seen today for discussion of ultrasound findings due to hx of ovarian cyst on left ovary noted with ultrasound 06/26/2022.  Follow up was recommended by Arland Roller, CNM, who saw pt on 01/09/2024.  Cycles have gotten heavier and she has been prescribed using Vienva .  This pill works really well for her.  Needs RF but doesn't want other generic replacement pills.  Currently out.  Cycles are better when on this pill.  Ultrasound reassuring today.  Trilaminar endometrium measured 7.59mm.  Ovaries appear normal with 13mm follicle on right cyst.  No enlarged cyst noted.  Cervix normal.  No free fluid present.  Pt reassure.    Patient's last menstrual period was 02/16/2024 (approximate).  Last pap 03/18/2023. Results were: NILM w/ HRHPV negative.   Review of Systems:   Pertinent items are noted in HPI Denies any urinary or bowel changes or pelvic pain Pertinent History Reviewed:  Reviewed past medical,surgical, social and family history.  Reviewed problem list, medications and allergies. Physical Assessment:   Vitals:   02/25/24 1355  BP: 120/65  Pulse: 75  SpO2: 97%  There is no height or weight on file to calculate BMI.        Physical Examination:   General appearance - well appearing, and in no distress  Mental status - alert, oriented to person, place, and time  Psych:  She has a normal mood and affect  Assessment & Plan:  1. Cyst of left ovary (Primary) - ultrasound today showed resolution of cyst with normal ovarian follicles noted  2. Menorrhagia with regular cycle - cycles are improved with OCP use  3. Encounter for surveillance of contraceptive pills - RF for OCP to pharmacy.  Advised pt to call with any issues with getting specific OCP that works well for her. - VIENVA   0.1-20 MG-MCG tablet; Take 1 tablet by mouth daily.  Dispense: 84 tablet; Refill: 0   Meds:  Meds ordered this encounter  Medications   VIENVA  0.1-20 MG-MCG tablet    Sig: Take 1 tablet by mouth daily.    Dispense:  84 tablet    Refill:  0    Ronal GORMAN Pinal, MD 02/28/2024 9:12 PM GYNECOLOGY  VISIT   "

## 2024-02-25 ENCOUNTER — Other Ambulatory Visit (HOSPITAL_BASED_OUTPATIENT_CLINIC_OR_DEPARTMENT_OTHER)

## 2024-02-25 ENCOUNTER — Encounter (HOSPITAL_BASED_OUTPATIENT_CLINIC_OR_DEPARTMENT_OTHER): Payer: Self-pay | Admitting: Obstetrics & Gynecology

## 2024-02-25 ENCOUNTER — Ambulatory Visit (INDEPENDENT_AMBULATORY_CARE_PROVIDER_SITE_OTHER): Admitting: Obstetrics & Gynecology

## 2024-02-25 ENCOUNTER — Other Ambulatory Visit (HOSPITAL_BASED_OUTPATIENT_CLINIC_OR_DEPARTMENT_OTHER): Payer: Self-pay

## 2024-02-25 VITALS — BP 120/65 | HR 75

## 2024-02-25 DIAGNOSIS — Z3041 Encounter for surveillance of contraceptive pills: Secondary | ICD-10-CM

## 2024-02-25 DIAGNOSIS — N920 Excessive and frequent menstruation with regular cycle: Secondary | ICD-10-CM | POA: Diagnosis not present

## 2024-02-25 DIAGNOSIS — N83202 Unspecified ovarian cyst, left side: Secondary | ICD-10-CM

## 2024-02-25 DIAGNOSIS — Z793 Long term (current) use of hormonal contraceptives: Secondary | ICD-10-CM

## 2024-02-25 MED ORDER — VIENVA 0.1-20 MG-MCG PO TABS
1.0000 | ORAL_TABLET | Freq: Every day | ORAL | 0 refills | Status: AC
  Filled 2024-02-25: qty 84, 84d supply, fill #0
